# Patient Record
Sex: Female | Born: 1935 | Race: Black or African American | Hispanic: No | Marital: Single | Smoking: Never smoker
Health system: Southern US, Community
[De-identification: ages and names within clinical notes are randomized; demographics above are authoritative.]

## PROBLEM LIST (undated history)

## (undated) DIAGNOSIS — I639 Cerebral infarction, unspecified: Secondary | ICD-10-CM

## (undated) DIAGNOSIS — E785 Hyperlipidemia, unspecified: Secondary | ICD-10-CM

## (undated) DIAGNOSIS — E119 Type 2 diabetes mellitus without complications: Secondary | ICD-10-CM

## (undated) DIAGNOSIS — I10 Essential (primary) hypertension: Secondary | ICD-10-CM

## (undated) DIAGNOSIS — I739 Peripheral vascular disease, unspecified: Secondary | ICD-10-CM

## (undated) HISTORY — DX: Essential (primary) hypertension: I10

## (undated) HISTORY — DX: Hyperlipidemia, unspecified: E78.5

---

## 2019-05-05 ENCOUNTER — Telehealth: Payer: Self-pay

## 2019-05-05 NOTE — Telephone Encounter (Signed)
Copied from [place] [id number]. Topic: General - Other >> May 05, 2019  2:11 PM Ivar Drape wrote: Reason for CRM: Patient has a new patient appt on 05/18/2019 and wants the practice to call her old provider so her records will be there before her first appt.  Dr. Theotis Barrio  [phone]

## 2019-05-06 NOTE — Telephone Encounter (Signed)
Patient has called back and been informed she needs to sign a release of records at that office for them to release to our office.  She verbalized understanding.

## 2019-05-06 NOTE — Telephone Encounter (Signed)
Patient informed Medical Records Release form has to be filled out, advised the patient if she didn't want to come in prior to her appointment contact her previous providers and inquire if they can release records. Patient will have records fax to

## 2019-05-06 NOTE — Telephone Encounter (Signed)
Called no answer no voicemail set up

## 2019-05-06 NOTE — Telephone Encounter (Signed)
Called no answer/no msg.///she will need to sign a release records for this

## 2019-05-11 ENCOUNTER — Ambulatory Visit: Payer: Self-pay | Admitting: Family Medicine

## 2019-05-17 ENCOUNTER — Other Ambulatory Visit: Payer: Self-pay

## 2019-05-18 ENCOUNTER — Other Ambulatory Visit: Payer: Self-pay | Admitting: Family Medicine

## 2019-05-18 ENCOUNTER — Ambulatory Visit: Payer: Medicare HMO | Admitting: Family Medicine

## 2019-05-18 ENCOUNTER — Encounter: Payer: Self-pay | Admitting: Family Medicine

## 2019-05-18 VITALS — BP 160/86 | HR 50 | Temp 97.1°F | Ht 64.0 in | Wt 158.0 lb

## 2019-05-18 DIAGNOSIS — E785 Hyperlipidemia, unspecified: Secondary | ICD-10-CM

## 2019-05-18 DIAGNOSIS — E119 Type 2 diabetes mellitus without complications: Secondary | ICD-10-CM | POA: Diagnosis not present

## 2019-05-18 DIAGNOSIS — I739 Peripheral vascular disease, unspecified: Secondary | ICD-10-CM

## 2019-05-18 DIAGNOSIS — E2839 Other primary ovarian failure: Secondary | ICD-10-CM

## 2019-05-18 DIAGNOSIS — Z23 Encounter for immunization: Secondary | ICD-10-CM

## 2019-05-18 DIAGNOSIS — R7989 Other specified abnormal findings of blood chemistry: Secondary | ICD-10-CM

## 2019-05-18 DIAGNOSIS — I10 Essential (primary) hypertension: Secondary | ICD-10-CM

## 2019-05-18 LAB — LIPID PANEL
Cholesterol: 140 mg/dL (ref 0–200)
HDL: 59.7 mg/dL (ref >39.00)
LDL Cholesterol: 60 mg/dL (ref 0–99)
NonHDL: 79.92
Total CHOL/HDL Ratio: 2
Triglycerides: 100 mg/dL (ref 0.0–149.0)
VLDL: 20 mg/dL (ref 0.0–40.0)

## 2019-05-18 LAB — COMPREHENSIVE METABOLIC PANEL
ALT: 9 U/L (ref 0–35)
AST: 13 U/L (ref 0–37)
Albumin: 4.3 g/dL (ref 3.5–5.2)
Alkaline Phosphatase: 131 U/L — ABNORMAL HIGH (ref 39–117)
BUN: 10 mg/dL (ref 6–23)
CO2: 28 mEq/L (ref 19–32)
Calcium: 10 mg/dL (ref 8.4–10.5)
Chloride: 104 mEq/L (ref 96–112)
Creatinine, Ser: 0.92 mg/dL (ref 0.40–1.20)
GFR: 70.7 mL/min (ref >60.00)
Glucose, Bld: 126 mg/dL — ABNORMAL HIGH (ref 70–99)
Potassium: 3.9 mEq/L (ref 3.5–5.1)
Sodium: 143 mEq/L (ref 135–145)
Total Bilirubin: 0.4 mg/dL (ref 0.2–1.2)
Total Protein: 7.6 g/dL (ref 6.0–8.3)

## 2019-05-18 LAB — HEMOGLOBIN A1C: Hgb A1c MFr Bld: 6.5 % (ref 4.6–6.5)

## 2019-05-18 LAB — MICROALBUMIN / CREATININE URINE RATIO
Creatinine,U: 200.9 mg/dL
Microalb Creat Ratio: 7.4 mg/g (ref 0.0–30.0)
Microalb, Ur: 14.9 mg/dL — ABNORMAL HIGH (ref 0.0–1.9)

## 2019-05-18 MED ORDER — HYDROCHLOROTHIAZIDE 25 MG PO TABS: 25.0000 mg | ORAL_TABLET | Freq: Every day | ORAL | 2 refills | Status: AC

## 2019-05-18 MED ORDER — ROSUVASTATIN CALCIUM 10 MG PO TABS: 10.0000 mg | ORAL_TABLET | Freq: Every day | ORAL | 2 refills | Status: AC

## 2019-05-18 MED ORDER — METFORMIN HCL 500 MG PO TABS: 500.0000 mg | ORAL_TABLET | Freq: Every day | ORAL | 2 refills | Status: AC

## 2019-05-18 MED ORDER — CLOPIDOGREL BISULFATE 75 MG PO TABS: 75.0000 mg | ORAL_TABLET | Freq: Every day | ORAL | 2 refills | Status: AC

## 2019-05-18 MED ORDER — AMLODIPINE BESYLATE 10 MG PO TABS: 10.0000 mg | ORAL_TABLET | Freq: Every day | ORAL | 2 refills | Status: AC

## 2019-05-18 MED ORDER — HYDRALAZINE HCL 50 MG PO TABS: 50.0000 mg | ORAL_TABLET | Freq: Three times a day (TID) | ORAL | 2 refills | Status: AC

## 2019-05-18 NOTE — Progress Notes (Signed)
Chief Complaint  Patient presents with  . New Patient (Initial Visit)       New Patient Visit SUBJECTIVE: HPI: Rhonda Golden is an 84 y.o.female who is being seen for establishing care.  The patient was previously seen in [place].  Hypertension Patient presents for hypertension follow up. She does monitor home blood pressures. Blood pressures ranging on average from 371-062'I systolic.  She is compliant with medications. Patient has these side effects of medication: none She is adhering to a healthy diet overall. Exercise: some walking  Patient has a recent history of diabetes.  She is taking metformin 500 mg daily.  No side effects and does report compliance.  Last set of labs/urine was over a year ago.  Reports diet is fair and she does do some walking.  Last eye exam was around 10 months ago.  She had a pneumonia shot a couple years ago, does not remember which one it was.  Hyperlipidemia Patient presents for dyslipidemia follow up. Currently being treated with Crestor 10 mg/d and compliance with treatment thus far has been good. She denies myalgias. She is adhering to a healthy diet. Exercise: some walking  PAD Patient has a history of peripheral artery disease with a history of bilateral stents.  She does take Plavix daily.  She is not on aspirin.  She is not having any pain in her lower extremities when she walks.  There is no swelling.     No Known Allergies  Past Medical History:  Diagnosis Date  . Hyperlipidemia   . Hypertension    History reviewed. No pertinent surgical history. Family History  Problem Relation Age of Onset  . Hypertension Mother   . Hypertension Father    No Known Allergies  Current Outpatient Medications:  .  amLODipine (NORVASC) 10 MG tablet, Take 10 mg by mouth daily., Disp: , Rfl:  .  clopidogrel (PLAVIX) 75 MG tablet, Take 75 mg by mouth daily., Disp: , Rfl:  .  hydrALAZINE (APRESOLINE) 50 MG tablet, Take 50 mg by mouth 3 (three) times  daily., Disp: , Rfl:  .  hydrochlorothiazide (HYDRODIURIL) 25 MG tablet, Take 25 mg by mouth daily., Disp: , Rfl:  .  metFORMIN (GLUCOPHAGE) 500 MG tablet, Take by mouth daily., Disp: , Rfl:  .  rosuvastatin (CRESTOR) 10 MG tablet, Take 10 mg by mouth daily., Disp: , Rfl:   ROS Constitutional: No fevers Endo: No weight loss Lungs: No shortness of breath Cardiovascular: No swelling or chest pain Skin: No sores on the feet Neuro: No numbness/tingling MSK: No pain in the feet GI: No diarrhea GU: No polyuria Eyes:+ Dry eyes Ears: No recent hearing loss   OBJECTIVE: BP (!) 160/86 (BP Location: Left Arm, Patient Position: Sitting, Cuff Size: Normal)   Pulse (!) 50   Temp (!) 97.1 F (36.2 C) (Temporal)   Ht 5\' 4"  (1.626 m)   Wt 158 lb (71.7 kg)   SpO2 93%   BMI 27.12 kg/m   Constitutional: -  VS reviewed -  Well developed, well nourished, appears stated age -  No apparent distress  Psychiatric: -  Oriented to person, place, and time -  Memory intact -  Affect and mood normal -  Fluent conversation, good eye contact -  Judgment and insight age appropriate  Eye: -  Conjunctivae clear, no discharge -  Pupils symmetric, round, reactive to light  ENMT: -  MMM    Pharynx moist, no exudate, no erythema  Neck: -  No  gross swelling, no palpable masses -  Thyroid midline, not enlarged, mobile, no palpable masses  Cardiovascular: -  RRR -  +SEM heard loudest at the aortic listening post -  No LE edema  Respiratory: -  Normal respiratory effort, no accessory muscle use, no retraction -  Breath sounds equal, no wheezes, no ronchi, no crackles  Gastrointestinal: -  Bowel sounds normal -  No tenderness, no distention, no guarding, no masses  Neurological:  -  CN II - XII grossly intact -  Sensation intact to pinprick bilaterally -  Sensation grossly intact to light touch, equal bilaterally  Musculoskeletal: -  No clubbing, no cyanosis -  Gait slow, not shuffling or antalgic  Skin:  -  No significant lesion on inspection of the feet -  Warm and dry to palpation   ASSESSMENT/PLAN: Diabetes mellitus type 2 in nonobese (HCC) - Plan: Hemoglobin A1c, Lipid panel, Comprehensive metabolic panel, Microalbumin / creatinine urine ratio  PAD (peripheral artery disease) (HCC) - Plan: Ambulatory referral to Cardiology  Essential hypertension  Hyperlipidemia, unspecified hyperlipidemia type  Estrogen deficiency - Plan: DG Bone Density  White coat syndrome with hypertension  Patient instructed to sign release of records form from her previous PCP.  Need to see when her last set of immunizations were. Okay with blood pressure given history of whitecoat syndrome.  She states that she checks at home daily. Counseled on diet and exercise. Refer to cardiology for PAD per her request.  Continue Plavix in the meantime. Patient should return 6 mo or prn. The patient voiced understanding and agreement to the plan.   Jilda Roche [place], DO 05/18/19  9:32 AM

## 2019-05-18 NOTE — Patient Instructions (Addendum)
Keep the diet clean and stay active.  If you do not hear anything about your referral in the next 1-2 weeks, call our office and ask for an update.  Give [place] 2-3 business days to get the results of your labs back.   Someone will call you regarding your bone density scan.  Find out when your tetanus shot and pneumonia shot were, and which one the pneumonia shot was if possible.  Let us know if you need anything.

## 2019-05-18 NOTE — Addendum Note (Signed)
Addended by: Sharon Seller B on: 05/18/2019 09:39 AM   Modules accepted: Orders

## 2019-06-15 ENCOUNTER — Other Ambulatory Visit: Payer: Medicare HMO

## 2019-06-22 ENCOUNTER — Other Ambulatory Visit (INDEPENDENT_AMBULATORY_CARE_PROVIDER_SITE_OTHER): Payer: Medicare HMO

## 2019-06-22 ENCOUNTER — Other Ambulatory Visit: Payer: Self-pay | Admitting: Family Medicine

## 2019-06-22 ENCOUNTER — Other Ambulatory Visit: Payer: Self-pay

## 2019-06-22 DIAGNOSIS — R748 Abnormal levels of other serum enzymes: Secondary | ICD-10-CM

## 2019-06-22 DIAGNOSIS — R7989 Other specified abnormal findings of blood chemistry: Secondary | ICD-10-CM | POA: Diagnosis not present

## 2019-06-22 LAB — HEPATIC FUNCTION PANEL
ALT: 9 U/L (ref 0–35)
AST: 12 U/L (ref 0–37)
Albumin: 4.1 g/dL (ref 3.5–5.2)
Alkaline Phosphatase: 130 U/L — ABNORMAL HIGH (ref 39–117)
Bilirubin, Direct: 0.1 mg/dL (ref 0.0–0.3)
Total Bilirubin: 0.4 mg/dL (ref 0.2–1.2)
Total Protein: 7.2 g/dL (ref 6.0–8.3)

## 2019-06-23 ENCOUNTER — Other Ambulatory Visit (INDEPENDENT_AMBULATORY_CARE_PROVIDER_SITE_OTHER): Payer: Medicare HMO

## 2019-06-23 DIAGNOSIS — R748 Abnormal levels of other serum enzymes: Secondary | ICD-10-CM | POA: Diagnosis not present

## 2019-06-23 LAB — GAMMA GT: GGT: 15 U/L (ref 7–51)

## 2019-06-28 LAB — ALKALINE PHOSPHATASE ISOENZYMES
Alkaline phosphatase (APISO): 129 U/L (ref 37–153)
Bone Isoenzymes: 39 % (ref 28–66)
Intestinal Isoenzymes: 3 % (ref 1–24)
Liver Isoenzymes: 58 % (ref 25–69)

## 2019-08-10 ENCOUNTER — Encounter: Payer: Self-pay | Admitting: Family Medicine

## 2019-08-10 ENCOUNTER — Ambulatory Visit: Payer: Medicare HMO | Admitting: Family Medicine

## 2019-08-10 ENCOUNTER — Other Ambulatory Visit: Payer: Self-pay

## 2019-08-10 VITALS — BP 132/88 | HR 99 | Temp 96.6°F | Wt 150.0 lb

## 2019-08-10 DIAGNOSIS — S76312A Strain of muscle, fascia and tendon of the posterior muscle group at thigh level, left thigh, initial encounter: Secondary | ICD-10-CM

## 2019-08-10 DIAGNOSIS — M7062 Trochanteric bursitis, left hip: Secondary | ICD-10-CM | POA: Diagnosis not present

## 2019-08-10 MED ORDER — PREDNISONE 20 MG PO TABS: 40.0000 mg | ORAL_TABLET | Freq: Every day | ORAL | 0 refills | Status: DC

## 2019-08-10 NOTE — Patient Instructions (Signed)
Heat (pad or rice pillow in microwave) over affected area, 10-15 minutes twice daily.   Ice/cold pack over area for 10-15 min twice daily.  OK to take Tylenol 1000 mg (2 extra strength tabs) or 975 mg (3 regular strength tabs) every 6 hours as needed.  Iliotibial Band Syndrome Rehab It is normal to feel mild stretching, pulling, tightness, or discomfort as you do these exercises, but you should stop right away if you feel sudden pain or your pain gets worse.  Stretching and range of motion exercises These exercises warm up your muscles and joints and improve the movement and flexibility of your hip and pelvis. Exercise A: Quadriceps, prone    1. Lie on your abdomen on a firm surface, such as a bed or padded floor. 2. Bend your left / right knee and hold your ankle. If you cannot reach your ankle or pant leg, loop a belt around your foot and grab the belt instead. 3. Gently pull your heel toward your buttocks. Your knee should not slide out to the side. You should feel a stretch in the front of your thigh and knee. 4. Hold this position for 30 seconds. Repeat 2 times. Complete this stretch 3 times per week. Exercise B: Iliotibial band    1. Lie on your side with your left / right leg in the top position. 2. Bend both of your knees and grab your left / right ankle. Stretch out your bottom arm to help you balance. 3. Slowly bring your top knee back so your thigh goes behind your trunk. 4. Slowly lower your top leg toward the floor until you feel a gentle stretch on the outside of your left / right hip and thigh. If you do not feel a stretch and your knee will not fall farther, place the heel of your other foot on top of your knee and pull your knee down toward the floor with your foot. 5. Hold this position for 30 seconds. Repeat 2 times. Complete this stretch 3 times per week. Strengthening exercises These exercises build strength and endurance in your hip and pelvis. Endurance is the  ability to use your muscles for a long time, even after they get tired. Exercise C: Straight leg raises (hip abductors)     1. Lie on your side with your left / right leg in the top position. Lie so your head, shoulder, knee, and hip line up. You may bend your bottom knee to help you balance. 2. Roll your hips slightly forward so your hips are stacked directly over each other and your left / right knee is facing forward. 3. Tense the muscles in your outer thigh and lift your top leg 4-6 inches (10-15 cm). 4. Hold this position for 3 seconds. Repeat for a total of 10 reps. 5. Slowly return to the starting position. Let your muscles relax completely before doing another repetition. Repeat 2 times. Complete this exercise 3 times per week. Exercise D: Straight leg raises (hip extensors) 1. Lie on your abdomen on your bed or a firm surface. You can put a pillow under your hips if that is more comfortable. 2. Bend your left / right knee so your foot is straight up in the air. 3. Squeeze your buttock muscles and lift your left / right thigh off the bed. Do not let your back arch. 4. Tense this muscle as hard as you can without increasing any knee pain. 5. Hold this position for 2 seconds. Repeat for  a total of 10 reps 6. Slowly lower your leg to the starting position and allow it to relax completely. Repeat 2 times. Complete this exercise 3 times per week. Exercise E: Hip hike 1. Stand sideways on a bottom step. Stand on your left / right leg with your other foot unsupported next to the step. You can hold onto the railing or wall if needed for balance. 2. Keep your knees straight and your torso square. Then, lift your left / right hip up toward the ceiling. 3. Slowly let your left / right hip lower toward the floor, past the starting position. Your foot should get closer to the floor. Do not lean or bend your knees. Repeat 2 times. Complete this exercise 3 times per week.  Document Released:  07/28/2005 Document Revised: 04/01/2016 Document Reviewed: 06/29/2015 Elsevier Interactive Patient Education  2018 [facility]  It is normal to feel mild stretching, pulling, tightness, or discomfort as you do these exercises, but you should stop right away if you feel sudden pain or your pain gets worse.  Stretching and range of motion exercises These exercises warm up your muscles and joints and improve the movement and flexibility of your thigh. These exercises also help to relieve pain, numbness, and tingling. Exercise A: Hamstring stretch, supine    1. Lie on your back. Loop a belt or towel across the ball of your left / right foot The ball of your foot is on the walking surface, right under your toes. 2. Straighten your left / right knee and slowly pull on the belt to raise your leg. Stop when you feel a gentle stretch behind your left / right knee or thigh. ? Do not allow the knee to bend. ? Keep your other leg flat on the floor. 3. Hold this position for 30 seconds. Repeat 2 times. Complete this exercise 3 times a week. Strengthening exercises These exercises build strength and endurance in your thigh. Endurance is the ability to use your muscles for a long time, even after they get tired. Exercise B: Straight leg raises (hip extensors) 5. Lie on your belly on a bed or a firm surface with a pillow under your hips. 6. Bend your left / right knee so your foot is straight up in the air. 7. Squeeze your buttock muscles and lift your left / right thigh off the bed. Do not let your back arch. 8. Hold this position for 3 seconds. 9. Slowly return to the starting position. Let your muscles relax completely before you do another repetition. Repeat 2 times. Complete this exercise 3 times a week. Exercise C: Bridge (hip extensors)     1. Lie on your back on a firm surface with your knees bent and your feet flat on the floor. 2. Tighten your buttocks  muscles and lift your bottom off the floor until your trunk is level with your thighs. ? You should feel the muscles working in your buttocks and the back of your thighs. If you do not feel these muscles, slide your feet 1-2 inches (2.5-5 cm) farther away from your buttocks. ? Do not arch your back. 3. Hold this position for 3 seconds. 4. Slowly lower your hips to the starting position. 5. Let your buttocks muscles relax completely between repetitions. If this exercise is too easy, try doing it with your arms crossed over your chest. Repeat 2 times. Complete this exercise 3 times a week. Exercise D: Hamstring eccentric, prone  6. Lie on your belly on a bed or on the floor. 7. Start with your legs straight. Cross your legs at the ankles with your left / right leg on top. 8. Using your bottom leg to do the work, bend both knees. 9. Using just your left / right leg alone, slowly lower your leg back down toward the bed. Add a 5 lb weight as told by your health care provider. 10. Let your muscles relax completely between repetitions. Repeat 2 times. Complete this exercise 3 times a week. Exercise E: Squats 1. Stand in front of a table, with your feet and knees pointing straight ahead. You may rest your hands on the table for balance but not for support. 2. Slowly bend your knees and lower your hips like you are going to sit in a chair. Keep your thighs straight or pointed slightly outward. ? Keep your weight over your heels, not over your toes. ? Keep your lower legs upright so they are parallel with the table legs. ? Do not let your hips go lower than your knees. Stop when your knees are bent to the shape of an upside-down letter "L" (90 degree angle). ? Do not bend lower than told by your health care provider. ? If your knee pain increases, do not bend as low. 3. Hold the squat position 1-2 seconds. 4. Slowly push with your legs to return to standing. Do not use your hands to pull yourself to  standing. Repeat 2 times. Complete this exercise 3 times a week. Make sure you discuss any questions you have with your health care provider. Document Released: 07/28/2005 Document Revised: 04/03/2016 Document Reviewed: 05/01/2015 Elsevier Interactive Patient Education  [employer].

## 2019-08-10 NOTE — Progress Notes (Signed)
Musculoskeletal Exam  Patient: Rhonda Golden DOB: 26-Jul-1935  DOS: 08/10/2019  SUBJECTIVE:  Chief Complaint:   Chief Complaint  Patient presents with  . Extremity Weakness    left leg     Rhonda Golden is a 84 y.o.  female for evaluation and treatment of LLE pain.   Onset:  1 day ago. No inj or change in activity.  Location: L outer hip, behind knee Character:  aching and sharp  Progression of issue:  is unchanged Associated symptoms: radiates down leg and behind knee Treatment: to date has been none.   Neurovascular symptoms: no  ROS: Musculoskeletal/Extremities: +L hip pain Neuro: No numbness/tingling  Past Medical History:  Diagnosis Date  . Hyperlipidemia   . Hypertension     Objective: VITAL SIGNS: BP 132/88 (BP Location: Left Arm, Patient Position: Sitting, Cuff Size: Large)   Pulse 99   Temp (!) 96.6 F (35.9 C) (Temporal)   Wt 150 lb (68 kg)   SpO2 95%   BMI 25.75 kg/m  Constitutional: Well formed, well developed. No acute distress. Cardiovascular: Brisk cap refill Thorax & Lungs: No accessory muscle use Musculoskeletal: L hip.   Normal active range of motion: yes.   Normal passive range of motion: yes Tenderness to palpation: yes over greater troch bursa and distal hamstring Deformity: no Ecchymosis: no Tests positive: none Tests negative: log roll, stinchfield Neurologic: Normal sensory function.  Psychiatric: Normal mood. Age appropriate judgment and insight. Alert & oriented x 3.    Assessment:  Greater trochanteric bursitis of left hip - Plan: predniSONE (DELTASONE) 20 MG tablet  Strain of left hamstring, initial encounter - Plan: predniSONE (DELTASONE) 20 MG tablet  Plan: Orders as above. Tylenol, ice, heat, stretches for IT band and hamstring. F/u next week if no improvement, may have to do injection, which she was not interested in today. The patient and her daughter voiced understanding and agreement to the plan.   [place], DO 08/10/19  5:02 PM

## 2019-08-11 ENCOUNTER — Inpatient Hospital Stay (HOSPITAL_COMMUNITY)
Admission: EM | Admit: 2019-08-11 | Discharge: 2019-08-18 | DRG: 065 | Disposition: A | Discharge status: Skilled Nursing Facility | Payer: Medicare HMO | Attending: Internal Medicine | Admitting: Internal Medicine

## 2019-08-11 ENCOUNTER — Encounter: Payer: Self-pay | Admitting: *Deleted

## 2019-08-11 ENCOUNTER — Ambulatory Visit: Payer: Self-pay | Admitting: *Deleted

## 2019-08-11 ENCOUNTER — Encounter (HOSPITAL_COMMUNITY): Payer: Self-pay | Admitting: Emergency Medicine

## 2019-08-11 DIAGNOSIS — R29704 NIHSS score 4: Secondary | ICD-10-CM | POA: Diagnosis present

## 2019-08-11 DIAGNOSIS — I6350 Cerebral infarction due to unspecified occlusion or stenosis of unspecified cerebral artery: Secondary | ICD-10-CM | POA: Diagnosis not present

## 2019-08-11 DIAGNOSIS — I480 Paroxysmal atrial fibrillation: Secondary | ICD-10-CM | POA: Diagnosis present

## 2019-08-11 DIAGNOSIS — G8194 Hemiplegia, unspecified affecting left nondominant side: Secondary | ICD-10-CM | POA: Diagnosis present

## 2019-08-11 DIAGNOSIS — I10 Essential (primary) hypertension: Secondary | ICD-10-CM | POA: Diagnosis present

## 2019-08-11 DIAGNOSIS — Z20822 Contact with and (suspected) exposure to covid-19: Secondary | ICD-10-CM | POA: Diagnosis present

## 2019-08-11 DIAGNOSIS — I639 Cerebral infarction, unspecified: Secondary | ICD-10-CM

## 2019-08-11 DIAGNOSIS — I6340 Cerebral infarction due to embolism of unspecified cerebral artery: Secondary | ICD-10-CM | POA: Diagnosis not present

## 2019-08-11 DIAGNOSIS — E119 Type 2 diabetes mellitus without complications: Secondary | ICD-10-CM

## 2019-08-11 DIAGNOSIS — Z8249 Family history of ischemic heart disease and other diseases of the circulatory system: Secondary | ICD-10-CM

## 2019-08-11 DIAGNOSIS — E876 Hypokalemia: Secondary | ICD-10-CM | POA: Diagnosis not present

## 2019-08-11 DIAGNOSIS — E785 Hyperlipidemia, unspecified: Secondary | ICD-10-CM | POA: Diagnosis present

## 2019-08-11 DIAGNOSIS — E1151 Type 2 diabetes mellitus with diabetic peripheral angiopathy without gangrene: Secondary | ICD-10-CM | POA: Diagnosis present

## 2019-08-11 DIAGNOSIS — R4781 Slurred speech: Secondary | ICD-10-CM | POA: Diagnosis present

## 2019-08-11 DIAGNOSIS — Z7902 Long term (current) use of antithrombotics/antiplatelets: Secondary | ICD-10-CM

## 2019-08-11 DIAGNOSIS — I739 Peripheral vascular disease, unspecified: Secondary | ICD-10-CM | POA: Diagnosis present

## 2019-08-11 HISTORY — DX: Type 2 diabetes mellitus without complications: E11.9

## 2019-08-11 HISTORY — DX: Cerebral infarction, unspecified: I63.9

## 2019-08-11 HISTORY — DX: Peripheral vascular disease, unspecified: I73.9

## 2019-08-11 LAB — I-STAT CHEM 8, ED
BUN: 25 mg/dL — ABNORMAL HIGH (ref 8–23)
Calcium, Ion: 1.16 mmol/L (ref 1.15–1.40)
Chloride: 108 mmol/L (ref 98–111)
Creatinine, Ser: 1 mg/dL (ref 0.44–1.00)
Glucose, Bld: 157 mg/dL — ABNORMAL HIGH (ref 70–99)
HCT: 44 % (ref 36.0–46.0)
Hemoglobin: 15 g/dL (ref 12.0–15.0)
Potassium: 3.7 mmol/L (ref 3.5–5.1)
Sodium: 142 mmol/L (ref 135–145)
TCO2: 23 mmol/L (ref 22–32)

## 2019-08-11 LAB — DIFFERENTIAL
Abs Immature Granulocytes: 0.04 10*3/uL (ref 0.00–0.07)
Basophils Absolute: 0 10*3/uL (ref 0.0–0.1)
Basophils Relative: 0 %
Eosinophils Absolute: 0 10*3/uL (ref 0.0–0.5)
Eosinophils Relative: 0 %
Immature Granulocytes: 0 %
Lymphocytes Relative: 6 %
Lymphs Abs: 0.6 10*3/uL — ABNORMAL LOW (ref 0.7–4.0)
Monocytes Absolute: 0.2 10*3/uL (ref 0.1–1.0)
Monocytes Relative: 2 %
Neutro Abs: 9.6 10*3/uL — ABNORMAL HIGH (ref 1.7–7.7)
Neutrophils Relative %: 92 %

## 2019-08-11 LAB — APTT: aPTT: 26 seconds (ref 24–36)

## 2019-08-11 LAB — COMPREHENSIVE METABOLIC PANEL
ALT: 14 U/L (ref 0–44)
AST: 19 U/L (ref 15–41)
Albumin: 3.9 g/dL (ref 3.5–5.0)
Alkaline Phosphatase: 97 U/L (ref 38–126)
Anion gap: 13 (ref 5–15)
BUN: 24 mg/dL — ABNORMAL HIGH (ref 8–23)
CO2: 21 mmol/L — ABNORMAL LOW (ref 22–32)
Calcium: 9.5 mg/dL (ref 8.9–10.3)
Chloride: 106 mmol/L (ref 98–111)
Creatinine, Ser: 1.08 mg/dL — ABNORMAL HIGH (ref 0.44–1.00)
GFR calc Af Amer: 55 mL/min — ABNORMAL LOW (ref >60)
GFR calc non Af Amer: 48 mL/min — ABNORMAL LOW (ref >60)
Glucose, Bld: 167 mg/dL — ABNORMAL HIGH (ref 70–99)
Potassium: 3.8 mmol/L (ref 3.5–5.1)
Sodium: 140 mmol/L (ref 135–145)
Total Bilirubin: 0.3 mg/dL (ref 0.3–1.2)
Total Protein: 7.9 g/dL (ref 6.5–8.1)

## 2019-08-11 LAB — CBC
HCT: 44.8 % (ref 36.0–46.0)
Hemoglobin: 13.6 g/dL (ref 12.0–15.0)
MCH: 23.3 pg — ABNORMAL LOW (ref 26.0–34.0)
MCHC: 30.4 g/dL (ref 30.0–36.0)
MCV: 76.7 fL — ABNORMAL LOW (ref 80.0–100.0)
Platelets: 311 10*3/uL (ref 150–400)
RBC: 5.84 MIL/uL — ABNORMAL HIGH (ref 3.87–5.11)
RDW: 17.2 % — ABNORMAL HIGH (ref 11.5–15.5)
WBC: 10.3 10*3/uL (ref 4.0–10.5)
nRBC: 0 % (ref 0.0–0.2)

## 2019-08-11 LAB — PROTIME-INR
INR: 1 (ref 0.8–1.2)
Prothrombin Time: 12.8 seconds (ref 11.4–15.2)

## 2019-08-11 MED ORDER — SODIUM CHLORIDE 0.9% FLUSH: 3.0000 mL | Freq: Once | INTRAVENOUS | Status: DC

## 2019-08-11 NOTE — ED Triage Notes (Signed)
EMS stated, she called cause of numbness in hands, bilateral grips equal. No muffled speech. Questionable any symptoms of a stroke.

## 2019-08-11 NOTE — Telephone Encounter (Signed)
This encounter was created in error - please disregard.

## 2019-08-11 NOTE — Telephone Encounter (Signed)
Daughter and cousin of pt called regarding weakness and dragging of her left foot. Pt also has slurred speech. Cousin stated she had tried to get off the bed and could not. And she was not like this this yesterday. She is advised to call 911. She stated that she would. Will route to [facility] at [facility] for review.  Reason for Disposition . [1] Weakness (i.e., paralysis, loss of muscle strength) of the face, arm / hand, or leg / foot on one side of the body AND [2] sudden onset AND [3] present now  Answer Assessment - Initial Assessment Questions 1. SYMPTOM: "What is the main symptom you are concerned about?" (e.g., weakness, numbness)     Weakness in left leg 2. ONSET: "When did this start?" (minutes, hours, days; while sleeping)     today 3. LAST NORMAL: "When was the last time you were normal (no symptoms)?"     The day before 4. PATTERN "Does this come and go, or has it been constant since it started?"  "Is it present now?"     constant 5. CARDIAC SYMPTOMS: "Have you had any of the following symptoms: chest pain, difficulty breathing, palpitations?"     no 6. NEUROLOGIC SYMPTOMS: "Have you had any of the following symptoms: headache, dizziness, vision loss, double vision, changes in speech, unsteady on your feet?"     Unsteady on her feet, slurred speech 7. OTHER SYMPTOMS: "Do you have any other symptoms?"      8. PREGNANCY: "Is there any chance you are pregnant?" "When was your last menstrual period?"     n/a  Protocols used: NEUROLOGIC DEFICIT-A-AH

## 2019-08-11 NOTE — ED Triage Notes (Signed)
Pt. Stated, I think maybe I had a mild stroke but not now.  VAN- negative.

## 2019-08-12 ENCOUNTER — Emergency Department (HOSPITAL_COMMUNITY): Payer: Medicare HMO

## 2019-08-12 ENCOUNTER — Encounter (HOSPITAL_COMMUNITY): Payer: Self-pay | Admitting: Internal Medicine

## 2019-08-12 ENCOUNTER — Other Ambulatory Visit: Payer: Self-pay

## 2019-08-12 ENCOUNTER — Encounter (HOSPITAL_COMMUNITY): Payer: Medicare HMO

## 2019-08-12 ENCOUNTER — Inpatient Hospital Stay (HOSPITAL_COMMUNITY): Payer: Medicare HMO

## 2019-08-12 DIAGNOSIS — I6350 Cerebral infarction due to unspecified occlusion or stenosis of unspecified cerebral artery: Secondary | ICD-10-CM | POA: Diagnosis present

## 2019-08-12 DIAGNOSIS — I6389 Other cerebral infarction: Secondary | ICD-10-CM | POA: Diagnosis not present

## 2019-08-12 DIAGNOSIS — Z7902 Long term (current) use of antithrombotics/antiplatelets: Secondary | ICD-10-CM | POA: Diagnosis not present

## 2019-08-12 DIAGNOSIS — E876 Hypokalemia: Secondary | ICD-10-CM | POA: Diagnosis not present

## 2019-08-12 DIAGNOSIS — R29704 NIHSS score 4: Secondary | ICD-10-CM | POA: Diagnosis present

## 2019-08-12 DIAGNOSIS — E785 Hyperlipidemia, unspecified: Secondary | ICD-10-CM | POA: Diagnosis present

## 2019-08-12 DIAGNOSIS — Z20822 Contact with and (suspected) exposure to covid-19: Secondary | ICD-10-CM | POA: Diagnosis present

## 2019-08-12 DIAGNOSIS — Z8249 Family history of ischemic heart disease and other diseases of the circulatory system: Secondary | ICD-10-CM | POA: Diagnosis not present

## 2019-08-12 DIAGNOSIS — E1151 Type 2 diabetes mellitus with diabetic peripheral angiopathy without gangrene: Secondary | ICD-10-CM | POA: Diagnosis present

## 2019-08-12 DIAGNOSIS — G8194 Hemiplegia, unspecified affecting left nondominant side: Secondary | ICD-10-CM | POA: Diagnosis present

## 2019-08-12 DIAGNOSIS — R4781 Slurred speech: Secondary | ICD-10-CM | POA: Diagnosis present

## 2019-08-12 DIAGNOSIS — I480 Paroxysmal atrial fibrillation: Secondary | ICD-10-CM | POA: Diagnosis present

## 2019-08-12 DIAGNOSIS — I739 Peripheral vascular disease, unspecified: Secondary | ICD-10-CM | POA: Diagnosis not present

## 2019-08-12 DIAGNOSIS — E119 Type 2 diabetes mellitus without complications: Secondary | ICD-10-CM | POA: Diagnosis not present

## 2019-08-12 DIAGNOSIS — I6340 Cerebral infarction due to embolism of unspecified cerebral artery: Secondary | ICD-10-CM | POA: Diagnosis present

## 2019-08-12 DIAGNOSIS — I10 Essential (primary) hypertension: Secondary | ICD-10-CM | POA: Diagnosis present

## 2019-08-12 LAB — GLUCOSE, CAPILLARY
Glucose-Capillary: 102 mg/dL — ABNORMAL HIGH (ref 70–99)
Glucose-Capillary: 104 mg/dL — ABNORMAL HIGH (ref 70–99)
Glucose-Capillary: 122 mg/dL — ABNORMAL HIGH (ref 70–99)

## 2019-08-12 LAB — CBG MONITORING, ED: Glucose-Capillary: 130 mg/dL — ABNORMAL HIGH (ref 70–99)

## 2019-08-12 LAB — HEPARIN LEVEL (UNFRACTIONATED): Heparin Unfractionated: 0.25 IU/mL — ABNORMAL LOW (ref 0.30–0.70)

## 2019-08-12 LAB — SARS CORONAVIRUS 2 (TAT 6-24 HRS): SARS Coronavirus 2: NEGATIVE

## 2019-08-12 LAB — ECHOCARDIOGRAM COMPLETE

## 2019-08-12 MED ORDER — HYDRALAZINE HCL 25 MG PO TABS: 50.0000 mg | ORAL_TABLET | Freq: Three times a day (TID) | ORAL | Status: DC

## 2019-08-12 MED ORDER — DILTIAZEM HCL-DEXTROSE 125-5 MG/125ML-% IV SOLN (PREMIX)
5.0000 mg/h | INTRAVENOUS | Status: DC
  Administered 2019-08-12: 5 mg/h via INTRAVENOUS
  Filled 2019-08-12 (×2): qty 125

## 2019-08-12 MED ORDER — HYDROCHLOROTHIAZIDE 25 MG PO TABS
25.0000 mg | ORAL_TABLET | Freq: Every day | ORAL | Status: DC
  Administered 2019-08-12: 08:00:00 25 mg via ORAL
  Filled 2019-08-12: qty 1

## 2019-08-12 MED ORDER — ASPIRIN 325 MG PO TABS: 325.0000 mg | ORAL_TABLET | Freq: Every day | ORAL | Status: DC

## 2019-08-12 MED ORDER — SENNOSIDES-DOCUSATE SODIUM 8.6-50 MG PO TABS: 1.0000 | ORAL_TABLET | Freq: Every evening | ORAL | Status: DC | PRN

## 2019-08-12 MED ORDER — BRIMONIDINE TARTRATE 0.15 % OP SOLN
1.0000 [drp] | Freq: Three times a day (TID) | OPHTHALMIC | Status: DC
  Administered 2019-08-12 – 2019-08-18 (×17): 1 [drp] via OPHTHALMIC
  Filled 2019-08-12: qty 5

## 2019-08-12 MED ORDER — AMLODIPINE BESYLATE 5 MG PO TABS: 10.0000 mg | ORAL_TABLET | Freq: Every day | ORAL | Status: DC

## 2019-08-12 MED ORDER — HYDROCHLOROTHIAZIDE 25 MG PO TABS: 25.0000 mg | ORAL_TABLET | Freq: Every day | ORAL | Status: DC

## 2019-08-12 MED ORDER — CLOPIDOGREL BISULFATE 75 MG PO TABS
75.0000 mg | ORAL_TABLET | Freq: Every day | ORAL | Status: DC
  Administered 2019-08-13: 11:00:00 75 mg via ORAL
  Filled 2019-08-12: qty 1

## 2019-08-12 MED ORDER — STROKE: EARLY STAGES OF RECOVERY BOOK: Freq: Once | Status: AC

## 2019-08-12 MED ORDER — HALOPERIDOL LACTATE 5 MG/ML IJ SOLN
2.0000 mg | Freq: Four times a day (QID) | INTRAMUSCULAR | Status: DC | PRN
  Administered 2019-08-12: 12:00:00 2 mg via INTRAVENOUS
  Filled 2019-08-12: qty 1

## 2019-08-12 MED ORDER — HYDRALAZINE HCL 25 MG PO TABS
50.0000 mg | ORAL_TABLET | Freq: Three times a day (TID) | ORAL | Status: DC
  Administered 2019-08-12: 08:00:00 50 mg via ORAL
  Filled 2019-08-12: qty 2

## 2019-08-12 MED ORDER — ENOXAPARIN SODIUM 40 MG/0.4ML ~~LOC~~ SOLN: 40.0000 mg | Freq: Every day | SUBCUTANEOUS | Status: DC

## 2019-08-12 MED ORDER — ACETAMINOPHEN 160 MG/5ML PO SOLN
650.0000 mg | ORAL | Status: DC | PRN
  Administered 2019-08-15: 09:00:00 650 mg
  Filled 2019-08-12: qty 20.3

## 2019-08-12 MED ORDER — ASPIRIN EC 81 MG PO TBEC: 81.0000 mg | DELAYED_RELEASE_TABLET | Freq: Every day | ORAL | Status: DC

## 2019-08-12 MED ORDER — SODIUM CHLORIDE 0.9 % IV SOLN: INTRAVENOUS | Status: DC

## 2019-08-12 MED ORDER — INSULIN ASPART 100 UNIT/ML ~~LOC~~ SOLN
0.0000 [IU] | Freq: Three times a day (TID) | SUBCUTANEOUS | Status: DC
  Administered 2019-08-12 – 2019-08-13 (×2): 2 [IU] via SUBCUTANEOUS
  Administered 2019-08-13: 17:00:00 3 [IU] via SUBCUTANEOUS
  Administered 2019-08-14: 13:00:00 2 [IU] via SUBCUTANEOUS
  Administered 2019-08-16 – 2019-08-17 (×2): 3 [IU] via SUBCUTANEOUS
  Administered 2019-08-18 (×2): 2 [IU] via SUBCUTANEOUS

## 2019-08-12 MED ORDER — ROSUVASTATIN CALCIUM 5 MG PO TABS
10.0000 mg | ORAL_TABLET | Freq: Every day | ORAL | Status: DC
  Administered 2019-08-13 – 2019-08-18 (×6): 10 mg via ORAL
  Filled 2019-08-12 (×6): qty 2

## 2019-08-12 MED ORDER — ASPIRIN 300 MG RE SUPP: 300.0000 mg | Freq: Every day | RECTAL | Status: DC

## 2019-08-12 MED ORDER — LORAZEPAM 2 MG/ML IJ SOLN
1.0000 mg | Freq: Once | INTRAMUSCULAR | Status: AC
  Administered 2019-08-12: 09:00:00 1 mg via INTRAVENOUS
  Filled 2019-08-12: qty 1

## 2019-08-12 MED ORDER — ACETAMINOPHEN 650 MG RE SUPP: 650.0000 mg | RECTAL | Status: DC | PRN

## 2019-08-12 MED ORDER — INSULIN ASPART 100 UNIT/ML ~~LOC~~ SOLN: 0.0000 [IU] | Freq: Every day | SUBCUTANEOUS | Status: DC

## 2019-08-12 MED ORDER — ACETAMINOPHEN 325 MG PO TABS
650.0000 mg | ORAL_TABLET | ORAL | Status: DC | PRN
  Administered 2019-08-17: 325 mg via ORAL
  Administered 2019-08-18: 12:00:00 650 mg via ORAL
  Filled 2019-08-12 (×3): qty 2

## 2019-08-12 MED ORDER — AMLODIPINE BESYLATE 5 MG PO TABS
10.0000 mg | ORAL_TABLET | Freq: Every day | ORAL | Status: DC
  Administered 2019-08-12: 10 mg via ORAL
  Filled 2019-08-12: qty 2

## 2019-08-12 MED ORDER — HEPARIN (PORCINE) 25000 UT/250ML-% IV SOLN
1000.0000 [IU]/h | INTRAVENOUS | Status: DC
  Administered 2019-08-12: 15:00:00 900 [IU]/h via INTRAVENOUS
  Administered 2019-08-13: 1000 [IU]/h via INTRAVENOUS
  Filled 2019-08-12 (×2): qty 250

## 2019-08-12 NOTE — ED Notes (Signed)
Patient returns from MRI very drowsy.

## 2019-08-12 NOTE — Plan of Care (Signed)
  Problem: Self-Care: Goal: Ability to communicate needs accurately will improve Outcome: Progressing   Problem: Nutrition: Goal: Risk of aspiration will decrease Outcome: Progressing   Problem: Ischemic Stroke/TIA Tissue Perfusion: Goal: Complications of ischemic stroke/TIA will be minimized Outcome: Progressing

## 2019-08-12 NOTE — ED Provider Notes (Signed)
[facility] EMERGENCY DEPARTMENT Provider Note   CSN: [id number] Arrival date & time: 08/11/19  1907     History Chief Complaint  Patient presents with  . Cerebrovascular Accident    Questionable no symptoms    Rhonda Golden is a 85 y.o. female.  HPI   Patient presents to the emergency room for evaluation of weakness.  Patient states she noticed the symptoms yesterday morning when she tried to get up.  Patient felt weak.  She has had a stroke in the past but she feels the left side of her body is weaker than usual.  Patient was concerned that she had another stroke.  According to the EMS report the patient called EMS yesterday evening.  She arrived over 12 hours ago.  At that time the report was she was having numbness in her hands.  Patient feels that the symptoms persist.  She is not having any headache.  No fevers or chills.  Past Medical History:  Diagnosis Date  . Hyperlipidemia   . Hypertension     Patient Active Problem List   Diagnosis Date Noted  . Diabetes mellitus type 2 in nonobese (HCC) 05/18/2019  . PAD (peripheral artery disease) (HCC) 05/18/2019  . Hyperlipidemia 05/18/2019  . Essential hypertension 05/18/2019    History reviewed. No pertinent surgical history.   OB History   No obstetric history on file.     Family History  Problem Relation Age of Onset  . Hypertension Mother   . Hypertension Father     Social History   Tobacco Use  . Smoking status: Never Smoker  . Smokeless tobacco: Never Used  Substance Use Topics  . Alcohol use: Not on file  . Drug use: Not on file    Home Medications Prior to Admission medications   Medication Sig Start Date End Date Taking? Authorizing Provider  amLODipine (NORVASC) 10 MG tablet Take 1 tablet (10 mg total) by mouth daily. 05/18/19   Sharlene Dory, DO  clopidogrel (PLAVIX) 75 MG tablet Take 1 tablet (75 mg total) by mouth daily. 05/18/19   Sharlene Dory, DO    hydrALAZINE (APRESOLINE) 50 MG tablet Take 1 tablet (50 mg total) by mouth 3 (three) times daily. 05/18/19   Sharlene Dory, DO  hydrochlorothiazide (HYDRODIURIL) 25 MG tablet Take 1 tablet (25 mg total) by mouth daily. 05/18/19   Sharlene Dory, DO  metFORMIN (GLUCOPHAGE) 500 MG tablet Take 1 tablet (500 mg total) by mouth daily. 05/18/19   Sharlene Dory, DO  predniSONE (DELTASONE) 20 MG tablet Take 2 tablets (40 mg total) by mouth daily with breakfast for 5 days. 08/10/19 08/15/19  Sharlene Dory, DO  rosuvastatin (CRESTOR) 10 MG tablet Take 1 tablet (10 mg total) by mouth daily. 05/18/19   Sharlene Dory, DO    Allergies    Patient has no known allergies.  Review of Systems   Review of Systems  All other systems reviewed and are negative.   Physical Exam Updated Vital Signs BP (!) 194/75 (BP Location: Right Arm)   Pulse 73   Temp 97.8 F (36.6 C) (Oral)   Resp (!) 23   SpO2 99%   Physical Exam Vitals and nursing note reviewed.  Constitutional:      General: She is not in acute distress.    Appearance: She is well-developed.  HENT:     Head: Normocephalic and atraumatic.     Right Ear: External ear normal.  Left Ear: External ear normal.  Eyes:     General: No scleral icterus.       Right eye: No discharge.        Left eye: No discharge.     Conjunctiva/sclera: Conjunctivae normal.  Neck:     Trachea: No tracheal deviation.  Cardiovascular:     Rate and Rhythm: Normal rate and regular rhythm.  Pulmonary:     Effort: Pulmonary effort is normal. No respiratory distress.     Breath sounds: Normal breath sounds. No stridor. No wheezing or rales.  Abdominal:     General: Bowel sounds are normal. There is no distension.     Palpations: Abdomen is soft.     Tenderness: There is no abdominal tenderness. There is no guarding or rebound.  Musculoskeletal:        General: No tenderness.     Cervical back: Neck supple.  Skin:     General: Skin is warm and dry.     Findings: No rash.  Neurological:     Mental Status: She is alert and oriented to person, place, and time.     Cranial Nerves: No cranial nerve deficit (left  facial droop, extraocular movements intact, tongue midline   ).     Sensory: No sensory deficit.     Motor: Weakness present. No abnormal muscle tone or seizure activity.     Coordination: Coordination abnormal.     Comments: Left  pronator drift bilateral upper extrem, unable to hold left leg off bed for 5 seconds, sensation intact in all extremities, no visual field cuts, no left or right sided neglect, abnormal finger-nose exam left hand, no nystagmus noted      ED Results / Procedures / Treatments   Labs (all labs ordered are listed, but only abnormal results are displayed) Labs Reviewed  CBC - Abnormal; Notable for the following components:      Result Value   RBC 5.84 (*)    MCV 76.7 (*)    MCH 23.3 (*)    RDW 17.2 (*)    All other components within normal limits  DIFFERENTIAL - Abnormal; Notable for the following components:   Neutro Abs 9.6 (*)    Lymphs Abs 0.6 (*)    All other components within normal limits  COMPREHENSIVE METABOLIC PANEL - Abnormal; Notable for the following components:   CO2 21 (*)    Glucose, Bld 167 (*)    BUN 24 (*)    Creatinine, Ser 1.08 (*)    GFR calc non Af Amer 48 (*)    GFR calc Af Amer 55 (*)    All other components within normal limits  I-STAT CHEM 8, ED - Abnormal; Notable for the following components:   BUN 25 (*)    Glucose, Bld 157 (*)    All other components within normal limits  PROTIME-INR  APTT  CBG MONITORING, ED    EKG EKG Interpretation  Date/Time:  Friday August 12 2019 07:58:27 EST Ventricular Rate:  83 PR Interval:    QRS Duration: 81 QT Interval:  379 QTC Calculation: 446 R Axis:   41 Text Interpretation: Sinus rhythm Abnormal R-wave progression, early transition Nonspecific T abnormalities, lateral leads No old  tracing to compare Confirmed by Dorie Rank [phone]) on 08/12/2019 9:58:01 AM   Radiology CT Head Wo Contrast  Result Date: 08/12/2019 CLINICAL DATA:  Cerebral hemorrhage suspected. Additional provided: Numbness in hands, bilateral grips equal. Muffled speech. EXAM: CT HEAD WITHOUT CONTRAST TECHNIQUE:  Contiguous axial images were obtained from the base of the skull through the vertex without intravenous contrast. COMPARISON:  No pertinent prior studies available for comparison. FINDINGS: Brain: No evidence of acute intracranial hemorrhage. No evidence of acute demarcated cortical infarction. Chronic right PCA vascular territory cortically based infarct involving the right parietooccipital and posteromedial right temporal lobes. No evidence of intracranial mass. No midline shift or extra-axial fluid collection. Mild ill-defined hypoattenuation within the cerebral white matter is nonspecific, but consistent with chronic small vessel ischemic disease. Mild generalized parenchymal atrophy. Mineralization within the bilateral basal ganglia. Vascular: No hyperdense vessel.  Atherosclerotic calcifications. Skull: Normal. Negative for fracture or focal lesion. Sinuses/Orbits: Visualized orbits demonstrate no acute abnormality. Mild ethmoid sinus mucosal thickening. No significant mastoid effusion. IMPRESSION: No CT evidence of acute intracranial abnormality. Chronic right PCA vascular territory cortically based infarct. Mild generalized parenchymal atrophy and chronic small vessel ischemic disease. Electronically Signed   By: Jackey Loge DO   On: 08/12/2019 06:11   MR BRAIN WO CONTRAST  Result Date: 08/12/2019 CLINICAL DATA:  Focal neuro deficit, greater than 6 hours, stroke suspected. EXAM: MRI HEAD WITHOUT CONTRAST TECHNIQUE: Multiplanar, multiecho pulse sequences of the brain and surrounding structures were obtained without intravenous contrast. COMPARISON:  Head CT 08/12/2019 FINDINGS: Brain: Multiple sequences are  motion degraded. Most notably, there is moderate motion degradation of the axial T2 FLAIR sequence 12 x 10 mm focus of restricted diffusion within the right ventral pons consistent with acute infarct (series 5, image 67). Corresponding T2/FLAIR hyperintensity at this site. Redemonstrated chronic right PCA territory cortically based infarct. No intracranial mass is identified. No midline shift or extra-axial fluid collection. There are several supratentorial chronic microhemorrhages. Small chronic lacunar infarcts within the basal ganglia, thalami and cerebellum. Background of otherwise mild chronic small vessel ischemic disease. Moderate generalized parenchymal atrophy. Asymmetric T2/FLAIR hyperintensity within the left petrous apex. Vascular: Flow voids maintained within the proximal large arterial vessels. Skull and upper cervical spine: No focal marrow lesion Sinuses/Orbits: Bilateral lens replacements. Minimal ethmoid sinus mucosal thickening. No significant mastoid effusion. IMPRESSION: Motion degraded examination. 12 x 10 mm acute infarct within the right ventral pons. Redemonstrated chronic right PCA territory cortically based infarct. Multiple small chronic lacunar infarcts within the basal ganglia, thalami and cerebellum. Background of otherwise mild chronic small vessel ischemic disease. Moderate generalized parenchymal atrophy. Asymmetric T2 hyperintensity within the left petrous apex. This finding is nonspecific, but may reflect trapped fluid. Electronically Signed   By: Jackey Loge DO   On: 08/12/2019 09:43    Procedures Procedures (including critical care time)  Medications Ordered in ED Medications  sodium chloride flush (NS) 0.9 % injection 3 mL (has no administration in time range)  amLODipine (NORVASC) tablet 10 mg (10 mg Oral Given 08/12/19 0825)  hydrALAZINE (APRESOLINE) tablet 50 mg (50 mg Oral Given 08/12/19 0825)  hydrochlorothiazide (HYDRODIURIL) tablet 25 mg (25 mg Oral Given 08/12/19  0825)  LORazepam (ATIVAN) injection 1 mg (1 mg Intravenous Given 08/12/19 0836)    ED Course  I have reviewed the triage vital signs and the nursing notes.  Pertinent labs & imaging results that were available during my care of the patient were reviewed by me and considered in my medical decision making (see chart for details).  Clinical Course as of Aug 11 998  Fri Aug 12, 2019  7829 CT scan findings reviewed.  No acute stroke.   [JK]  [id number] Labs reviewed.  Mild increase in creatinine compared to baseline.  Blood pressure notably elevated.   [JK]  [id number] Exam concerning for stroke.  Left sided sx.  Prior stroke caused left sided deficits.  Pt states sx today are new.  Will mri to evaluate further.  Will give bp meds.   Pt is anxious about MRI.   Will give dose of ativan for mri   [JK]  3709 Discussed findings with the patient.  She is still somewhat sedated after her Ativan dose   [JK]    Clinical Course User Index [JK] Linwood Dibbles, MD   MDM Rules/Calculators/A&P               NIH Stroke Scale: 4       Patient presented to the emergency room with complaints of stroke symptoms.  Patient had been waiting for approximately 12 hours prior to my evaluation.  Patient states that symptoms started the morning prior to that.  Patient is outside TPA window.  CT scan did not show any acute findings but the MRA does demonstrate an acute stroke.  I will consult with neurology and the medical service for admission and further treatment.   Final Clinical Impression(s) / ED Diagnoses Final diagnoses:  Cerebral infarction, unspecified mechanism (HCC)  Hypertension, unspecified type      Linwood Dibbles, MD 08/12/19 1000

## 2019-08-12 NOTE — Consult Note (Addendum)
NEURO HOSPITALIST  CONSULT   Requesting Physician: Dr. Lorin Mercy    Chief Complaint: left side weakness  History obtained from:  Chart review  HPI:                                                                                                                                         Rhonda Golden is an 85 y.o. female  With PMH siginficant for HTN, HLD, CVA presented to [facility] ED for c/o 1 day history of weakness.  Per chart review: Patient stated that she noticed the weakness yesterday morning when she tried to get up. She has had a stroke in the past, but feels that the left side of her body is weaker than usual.  ED course:  CTH: no hemorrhage; chronic right PCA cortically based infarct. MRI: 12x10 mm acuter infarct in the right ventral pons. tPA Given: No: outside of window Last known well: The night of 08/10/2019  Past Medical History:  Diagnosis Date  . Hyperlipidemia   . Hypertension     History reviewed. No pertinent surgical history.  Family History  Problem Relation Age of Onset  . Hypertension Mother   . Hypertension Father          Social History:  reports that she has never smoked. She has never used smokeless tobacco. No history on file for alcohol and drug.  Allergies: No Known Allergies  Medications:                                                                                                                           Current Facility-Administered Medications  Medication Dose Route Frequency Provider Last Rate Last Admin  . amLODipine (NORVASC) tablet 10 mg  10 mg Oral Daily Dorie Rank, MD   10 mg at 08/12/19 0825  . hydrALAZINE (APRESOLINE) tablet 50 mg  50 mg Oral TID Dorie Rank, MD   50 mg at 08/12/19 0825  . hydrochlorothiazide (HYDRODIURIL) tablet 25 mg  25 mg Oral Daily Dorie Rank, MD   25 mg at 08/12/19 0825  . sodium chloride flush (NS) 0.9 %  injection 3 mL  3 mL Intravenous Once Linwood Dibbles, MD       Current  Outpatient Medications  Medication Sig Dispense Refill  . amLODipine (NORVASC) 10 MG tablet Take 1 tablet (10 mg total) by mouth daily. 90 tablet 2  . clopidogrel (PLAVIX) 75 MG tablet Take 1 tablet (75 mg total) by mouth daily. 90 tablet 2  . hydrALAZINE (APRESOLINE) 50 MG tablet Take 1 tablet (50 mg total) by mouth 3 (three) times daily. 270 tablet 2  . hydrochlorothiazide (HYDRODIURIL) 25 MG tablet Take 1 tablet (25 mg total) by mouth daily. 90 tablet 2  . metFORMIN (GLUCOPHAGE) 500 MG tablet Take 1 tablet (500 mg total) by mouth daily. 90 tablet 2  . predniSONE (DELTASONE) 20 MG tablet Take 2 tablets (40 mg total) by mouth daily with breakfast for 5 days. 10 tablet 0  . rosuvastatin (CRESTOR) 10 MG tablet Take 1 tablet (10 mg total) by mouth daily. 90 tablet 2   ROS:                                                                                                                                        unobtainable from patient due to mental status  General Examination:                                                                                                      Blood pressure (!) 194/75, pulse 73, temperature 97.8 F (36.6 C), temperature source Oral, resp. rate (!) 23, SpO2 99 %. Physical Exam  Constitutional: Appears well-developed and well-nourished.  Psych: Affect appropriate to situation Eyes: Normal external eye and conjunctiva. HENT: Normocephalic, no lesions, without obvious abnormality.   Musculoskeletal-no joint tenderness, deformity or swelling Cardiovascular: Normal rate and regular rhythm.  Respiratory: Effort normal, non-labored breathing saturations WNL GI: Soft.  No distension. There is no tenderness.  Skin: WDI  Neurological Examination Mental Status: Patient extremely drowsy after receiving ativan for MRI. On re-assessment. Patient awake, alert, combative. In bilateral mitten restraints. Not following commands. Cranial Nerves: II: blinks to threat  bilaterally III,IV, VI: ptosis not present, extra-ocular motions intact bilaterally, pupils equal, round, reactive to light  V,VII: left facial droop.   Motor/Sensory: LUE and LLE patient did not move, but will swat and kick with right side to noxious stimuli applied on the left side. RUE and RLE able to move anti-gravity with good strength. Deep Tendon Reflexes: 2+ and symmetric biceps and patella Cerebellar: No ataxia  noted with movements Gait: deferred   Lab Results: Basic Metabolic Panel: Recent Labs  Lab 08/11/19 1936 08/11/19 2055  NA 140 142  K 3.8 3.7  CL 106 108  CO2 21*  --   GLUCOSE 167* 157*  BUN 24* 25*  CREATININE 1.08* 1.00  CALCIUM 9.5  --     CBC: Recent Labs  Lab 08/11/19 1936 08/11/19 2055  WBC 10.3  --   NEUTROABS 9.6*  --   HGB 13.6 15.0  HCT 44.8 44.0  MCV 76.7*  --   PLT 311  --     Imaging: CT Head Wo Contrast  Result Date: 08/12/2019 CLINICAL DATA:  Cerebral hemorrhage suspected. Additional provided: Numbness in hands, bilateral grips equal. Muffled speech. EXAM: CT HEAD WITHOUT CONTRAST TECHNIQUE: Contiguous axial images were obtained from the base of the skull through the vertex without intravenous contrast. COMPARISON:  No pertinent prior studies available for comparison. FINDINGS: Brain: No evidence of acute intracranial hemorrhage. No evidence of acute demarcated cortical infarction. Chronic right PCA vascular territory cortically based infarct involving the right parietooccipital and posteromedial right temporal lobes. No evidence of intracranial mass. No midline shift or extra-axial fluid collection. Mild ill-defined hypoattenuation within the cerebral white matter is nonspecific, but consistent with chronic small vessel ischemic disease. Mild generalized parenchymal atrophy. Mineralization within the bilateral basal ganglia. Vascular: No hyperdense vessel.  Atherosclerotic calcifications. Skull: Normal. Negative for fracture or focal lesion.  Sinuses/Orbits: Visualized orbits demonstrate no acute abnormality. Mild ethmoid sinus mucosal thickening. No significant mastoid effusion. IMPRESSION: No CT evidence of acute intracranial abnormality. Chronic right PCA vascular territory cortically based infarct. Mild generalized parenchymal atrophy and chronic small vessel ischemic disease. Electronically Signed   By: Jackey Loge DO   On: 08/12/2019 06:11   MR BRAIN WO CONTRAST  Result Date: 08/12/2019 CLINICAL DATA:  Focal neuro deficit, greater than 6 hours, stroke suspected. EXAM: MRI HEAD WITHOUT CONTRAST TECHNIQUE: Multiplanar, multiecho pulse sequences of the brain and surrounding structures were obtained without intravenous contrast. COMPARISON:  Head CT 08/12/2019 FINDINGS: Brain: Multiple sequences are motion degraded. Most notably, there is moderate motion degradation of the axial T2 FLAIR sequence 12 x 10 mm focus of restricted diffusion within the right ventral pons consistent with acute infarct (series 5, image 67). Corresponding T2/FLAIR hyperintensity at this site. Redemonstrated chronic right PCA territory cortically based infarct. No intracranial mass is identified. No midline shift or extra-axial fluid collection. There are several supratentorial chronic microhemorrhages. Small chronic lacunar infarcts within the basal ganglia, thalami and cerebellum. Background of otherwise mild chronic small vessel ischemic disease. Moderate generalized parenchymal atrophy. Asymmetric T2/FLAIR hyperintensity within the left petrous apex. Vascular: Flow voids maintained within the proximal large arterial vessels. Skull and upper cervical spine: No focal marrow lesion Sinuses/Orbits: Bilateral lens replacements. Minimal ethmoid sinus mucosal thickening. No significant mastoid effusion. IMPRESSION: Motion degraded examination. 12 x 10 mm acute infarct within the right ventral pons. Redemonstrated chronic right PCA territory cortically based infarct. Multiple  small chronic lacunar infarcts within the basal ganglia, thalami and cerebellum. Background of otherwise mild chronic small vessel ischemic disease. Moderate generalized parenchymal atrophy. Asymmetric T2 hyperintensity within the left petrous apex. This finding is nonspecific, but may reflect trapped fluid. Electronically Signed   By: Jackey Loge DO   On: 08/12/2019 09:43     Valentina Lucks, MSN, NP-C [facility] [phone]  08/12/2019, 10:21 AM   Assessment: 85 y.o. female  With HTN and HLD presented to [facility] ED with  c/o increased left side weakness.  The CTH did not show a hemorrhage; chronic right PCA cortically based infarct. The MRI revealed new 12x10 mm acute infarct in the right ventral pons-looks like small vessel etiology but can not r/u embolic event.  Admit for further stroke work-up  Stroke Risk Factors - hyperlipidemia and hypertension     Recommendations:  --CTA head and neck --Echocardiogram -- High intensity Statin if LDL > 70 -- HgbA1c, fasting lipid panel -- PT consult, OT consult, Speech consult --Telemetry monitoring --Frequent neuro checks --Stroke swallow screen  --Currently on Plavix 75 daily. Continue Plavix for now. Further recommendations on antiplatelets after completion of work-up. --please page stroke NP  Or  PA  Or MD from 8am -4 pm  as this patient from this time will be  followed by the stroke.   You can look them up on [email or web address]  Password [facility]   Attending Neurohospitalist Addendum Patient seen and examined with APP/Resident. Agree with the history and physical as documented above. Agree with the plan as documented, which I helped formulate. I have independently reviewed the chart, obtained history, review of systems and examined the patient.I have personally reviewed pertinent head/neck/spine imaging (CT/MRI). Please feel free to call with any questions. --- Milon Dikes, MD [facility] Pager: [phone]  If 7pm  to 7am, please call on call as listed on AMION.   ADDENDUM  -Called by the hospitalist as patient has atrial fibrillation on monitor. Needed recommendations on anticoagulation and blood pressure management. -From a neurological standpoint, can do heparin stroke protocol for A. fib with RVR if needed. -Also, rate control per primary team.  Allow as much as permissive hypertension is can be allowed with rate control.  -- Milon Dikes, MD [facility] Pager: [phone] If 7pm to 7am, please call on call as listed on AMION.

## 2019-08-12 NOTE — ED Notes (Signed)
DAUGHTER, LUCILLE EVANS, CALLED FOR PT INFORMATION.[phone]

## 2019-08-12 NOTE — Progress Notes (Signed)
Since the time of H&P, the patient has begun to awaken some from the Ativan.  She has been combative and belligerent, attempting to bite, kick, and pinch staff.  This could be a result of the CVA, but is different than prior to receiving the Ativan and so is thought to be a reaction to the Ativan.  As such, she was given 2 mg IV Haldol with some improvement in her agitation.  She remains pretty somnolent when not being aggravated.  Meanwhile, she appears to have developed afib with RVR into the 120-130s.  With goal for permissive HTN, I am reluctant to start a Cardizem drip.  I have discussed the patient with Dr. Diona Browner.  He recommends IV Cardizem low-dose drip without bolus but is reluctant to use Amiodarone.  He also considers standing low-dose Lopressor to lower HR > BP.   There is also a question of whether to start IV Heparin.  I discussed with Dr. Wilford Corner.  Can start Cardizem for rate control and Heparin for AC, both without boluses.   Georgana Curio, M.D.

## 2019-08-12 NOTE — ED Notes (Signed)
Patient transported to MRI 

## 2019-08-12 NOTE — ED Notes (Signed)
Pt requested to go to the bathroom, staff went in to assist. Pt very agitated and becoming very combative. Pt using right hand and right leg to swing at this EMT and RN Katie. Mitts placed on pt.

## 2019-08-12 NOTE — H&P (Signed)
History and Physical    Rhonda Golden ZOX:[id number] DOB: 03-04-1935 DOA: 08/11/2019  PCP: Shelda Pal, DO Consultants:  None Patient coming from:  Home - lives with her son and daughter-in-law; [place]: Daughter-in-law, Posey Pronto Leda Quail?) Amalia Hailey, [phone]; daughter in [place], [place], [phone]  Chief Complaint: possible stroke  HPI: Rhonda Golden is a 85 y.o. female with medical history significant of HTN; HLD; DM; PAD s/p leg stents; and prior h/o CVA (2013) presenting with neurologic symptoms.  She is currently too somnolent (from Ativan) to provide history.  Upon my entry into the room, she did wake up briefly and was able to tell me that she was trying to get up; she then lapsed back into deep slumber.  Her daughter reports that Wednesday she noticed difficult with her L leg - she was diagnosed with a hamstring injury, given ice/heat, prednisone.  She developed difficulty walking and started dragging her foot/leg with her walker.  They noticed slurred speech yesterday, like she was too tired to talk.  +dysphagia - she coughed after swallowing.  She moved to [place] in 04/2019 and is living with her daughter.  Her daughter thinks she takes the Plavix every day.  Her daughter thought her left face was drooping last night.  After prior stroke, she had left eye field vision cut and mild left-sided weakness.  She was independent with ADLs after rehab.  When her daughter spoke to her yesterday, her speech had been slurred then and the day before - she thought she might be groggy from medication. She sounded normal this AM.  She reported that she was unable to walk due to left leg weakness.  Her BP was difficult to control in [place], refractory and never under good control.  Her DM was diagnosed in the last year or two.  She had PAD with leg stents in [place].  No known COVID contacts.   ED Course:  CVA.  Symptoms started yesterday AM, presented last PM and waited in the ER for many hours.  Prior CVA with  residual L-sided weakness.  MRI confirms acute pontine CVA.  Given Ativan for MRI, very sleepy.  Neurology is consulting.  Review of Systems: As per HPI; otherwise review of systems reviewed and negative.   Ambulatory Status:  Ambulates with a walker  Past Medical History:  Diagnosis Date  . CVA (cerebral vascular accident) ([place])    2013; 2021  . Diabetes mellitus ([place])   . Hyperlipidemia   . Hypertension   . PAD (peripheral artery disease) (HCC)    leg stents    History reviewed. No pertinent surgical history.  Social History   Socioeconomic History  . Marital status: Single    Spouse name: Not on file  . Number of children: Not on file  . Years of education: Not on file  . Highest education level: Not on file  Occupational History  . Not on file  Tobacco Use  . Smoking status: Never Smoker  . Smokeless tobacco: Never Used  Substance and Sexual Activity  . Alcohol use: Not on file  . Drug use: Not on file  . Sexual activity: Not on file  Other Topics Concern  . Not on file  Social History Narrative  . Not on file   Social Determinants of Health   Financial Resource Strain:   . Difficulty of Paying Living Expenses: Not on file  Food Insecurity:   . Worried About Charity fundraiser in the Last Year: Not on file  .  Ran Out of Food in the Last Year: Not on file  Transportation Needs:   . Lack of Transportation (Medical): Not on file  . Lack of Transportation (Non-Medical): Not on file  Physical Activity:   . Days of Exercise per Week: Not on file  . Minutes of Exercise per Session: Not on file  Stress:   . Feeling of Stress : Not on file  Social Connections:   . Frequency of Communication with Friends and Family: Not on file  . Frequency of Social Gatherings with Friends and Family: Not on file  . Attends Religious Services: Not on file  . Active Member of Clubs or Organizations: Not on file  . Attends Banker Meetings: Not on file  . Marital  Status: Not on file  Intimate Partner Violence:   . Fear of Current or Ex-Partner: Not on file  . Emotionally Abused: Not on file  . Physically Abused: Not on file  . Sexually Abused: Not on file    No Known Allergies  Family History  Problem Relation Age of Onset  . Hypertension Mother   . Hypertension Father     Prior to Admission medications   Medication Sig Start Date End Date Taking? Authorizing Provider  amLODipine (NORVASC) 10 MG tablet Take 1 tablet (10 mg total) by mouth daily. 05/18/19   Sharlene Dory, DO  clopidogrel (PLAVIX) 75 MG tablet Take 1 tablet (75 mg total) by mouth daily. 05/18/19   Sharlene Dory, DO  hydrALAZINE (APRESOLINE) 50 MG tablet Take 1 tablet (50 mg total) by mouth 3 (three) times daily. 05/18/19   Sharlene Dory, DO  hydrochlorothiazide (HYDRODIURIL) 25 MG tablet Take 1 tablet (25 mg total) by mouth daily. 05/18/19   Sharlene Dory, DO  metFORMIN (GLUCOPHAGE) 500 MG tablet Take 1 tablet (500 mg total) by mouth daily. 05/18/19   Sharlene Dory, DO  predniSONE (DELTASONE) 20 MG tablet Take 2 tablets (40 mg total) by mouth daily with breakfast for 5 days. 08/10/19 08/15/19  Sharlene Dory, DO  rosuvastatin (CRESTOR) 10 MG tablet Take 1 tablet (10 mg total) by mouth daily. 05/18/19   Sharlene Dory, DO    Physical Exam: Vitals:   08/12/19 0824 08/12/19 1037 08/12/19 1100 08/12/19 1130  BP: (!) 194/75 (!) 185/58 (!) 180/60 (!) 162/76  Pulse: 73 62 62 67  Resp: (!) 23 16 18 18   Temp:      TempSrc:      SpO2: 99% 99% 99% 98%     . General:  Appears somnolent and is NAD; left facial droop is noted at rest . Eyes:  PERRL, normal lids, iris . ENT:  grossly normal hearing, lips & tongue, mmm . Neck:  no LAD, masses or thyromegaly; no carotid bruits . Cardiovascular:  RRR, no m/r/g. No LE edema.  Respiratory:   CTA bilaterally with no wheezes/rales/rhonchi.  Normal respiratory  effort. . Abdomen:  soft, NT, ND, NABS . Skin:  no rash or induration seen on limited exam . Musculoskeletal:   no bony abnormality; apparent left hemiparesis but unable to tell new vs. Old or the extent of weakness given sedation . Psychiatric:  Excessively somnolent from sedation . Neurologic:  Unable to effectively perform at this time - although there is left facial droop and left hemiparesis to some extent visible with limited exam    Radiological Exams on Admission: CT Head Wo Contrast  Result Date: 08/12/2019 CLINICAL DATA:  Cerebral hemorrhage  suspected. Additional provided: Numbness in hands, bilateral grips equal. Muffled speech. EXAM: CT HEAD WITHOUT CONTRAST TECHNIQUE: Contiguous axial images were obtained from the base of the skull through the vertex without intravenous contrast. COMPARISON:  No pertinent prior studies available for comparison. FINDINGS: Brain: No evidence of acute intracranial hemorrhage. No evidence of acute demarcated cortical infarction. Chronic right PCA vascular territory cortically based infarct involving the right parietooccipital and posteromedial right temporal lobes. No evidence of intracranial mass. No midline shift or extra-axial fluid collection. Mild ill-defined hypoattenuation within the cerebral white matter is nonspecific, but consistent with chronic small vessel ischemic disease. Mild generalized parenchymal atrophy. Mineralization within the bilateral basal ganglia. Vascular: No hyperdense vessel.  Atherosclerotic calcifications. Skull: Normal. Negative for fracture or focal lesion. Sinuses/Orbits: Visualized orbits demonstrate no acute abnormality. Mild ethmoid sinus mucosal thickening. No significant mastoid effusion. IMPRESSION: No CT evidence of acute intracranial abnormality. Chronic right PCA vascular territory cortically based infarct. Mild generalized parenchymal atrophy and chronic small vessel ischemic disease. Electronically Signed   By: Jackey Loge DO   On: 08/12/2019 06:11   MR BRAIN WO CONTRAST  Result Date: 08/12/2019 CLINICAL DATA:  Focal neuro deficit, greater than 6 hours, stroke suspected. EXAM: MRI HEAD WITHOUT CONTRAST TECHNIQUE: Multiplanar, multiecho pulse sequences of the brain and surrounding structures were obtained without intravenous contrast. COMPARISON:  Head CT 08/12/2019 FINDINGS: Brain: Multiple sequences are motion degraded. Most notably, there is moderate motion degradation of the axial T2 FLAIR sequence 12 x 10 mm focus of restricted diffusion within the right ventral pons consistent with acute infarct (series 5, image 67). Corresponding T2/FLAIR hyperintensity at this site. Redemonstrated chronic right PCA territory cortically based infarct. No intracranial mass is identified. No midline shift or extra-axial fluid collection. There are several supratentorial chronic microhemorrhages. Small chronic lacunar infarcts within the basal ganglia, thalami and cerebellum. Background of otherwise mild chronic small vessel ischemic disease. Moderate generalized parenchymal atrophy. Asymmetric T2/FLAIR hyperintensity within the left petrous apex. Vascular: Flow voids maintained within the proximal large arterial vessels. Skull and upper cervical spine: No focal marrow lesion Sinuses/Orbits: Bilateral lens replacements. Minimal ethmoid sinus mucosal thickening. No significant mastoid effusion. IMPRESSION: Motion degraded examination. 12 x 10 mm acute infarct within the right ventral pons. Redemonstrated chronic right PCA territory cortically based infarct. Multiple small chronic lacunar infarcts within the basal ganglia, thalami and cerebellum. Background of otherwise mild chronic small vessel ischemic disease. Moderate generalized parenchymal atrophy. Asymmetric T2 hyperintensity within the left petrous apex. This finding is nonspecific, but may reflect trapped fluid. Electronically Signed   By: Jackey Loge DO   On: 08/12/2019 09:43     EKG: Independently reviewed.  NSR with rate 83; nonspecific ST changes with no evidence of acute ischemia   Labs on Admission: I have personally reviewed the available labs and imaging studies at the time of the admission.  Pertinent labs:   BUN 24/Creatinine 1.08/GFR 48 WBC 10.3 INR 1.0  Assessment/Plan Principal Problem:   Right pontine CVA (HCC) Active Problems:   Diabetes mellitus type 2 in nonobese (HCC)   PAD (peripheral artery disease) (HCC)   Hyperlipidemia   Essential hypertension    CVA -Patient with prior h/o CVA presenting with several days of worsening left-sided weakness - initially her leg only and it was thought to be associated with a hamstring injury for which she was given steroids but it progressed to the arm and face with slurred speech and possible dysphagia as well -MRI confirms CVA in  the R pons, acute -Will admit for further CVA evaluation -Telemetry monitoring -Carotid dopplers -Echo -Risk stratification with FLP; will also check TSH -ASA daily added in addition to Plavix (daughter reports compliance with this medication at home) -Neurology consult -PT/OT/ST/Nutrition Consults -SW consult for placement  HTN -Allow permissive HTN for now -Treat BP only if >220/120, and then with goal of 15% reduction -Hold CCB, hydralazine, and HCTZ and plan to restart in 48-72 hours   HLD -Check FLP -Continue Crestor 10 mg daily for now   DM -Recent A1c shows reasonably good control (6.5) -Hold home PO medication (Glucophage) -Will order moderate-scale SSI  PAD -s/p stents while in [place]    Note: This patient has been tested and is pending for the novel coronavirus COVID-19.   Note: The patient recently moved to [place] from her home in [place] and is living with her daughter-in-law (whose husband, the patient's son, died and who is remarried).  Her daughter is the primary decision-maker but is in [place] and will be unable to come here.  There is shared  communication between the daughter and daughter-in-law.   DVT prophylaxis:  Lovenox  Code Status: Full - confirmed with family; note: the patient's daughter thinks that the patient wants to be DNR but isn't comfortable making this decision at this time and so asks that it be revisited when her mother is alert and communicative Family Communication: None present; I spoke with the patient's daughter and daughter-in-law by telephone Disposition Plan:  To be determined Consults called: Neurology; PT/OT/ST/Nutrition  Admission status: Admit - It is my clinical opinion that admission to INPATIENT is reasonable and necessary because of the expectation that this patient will require hospital care that crosses at least 2 midnights to treat this condition based on the medical complexity of the problems presented.  Given the aforementioned information, the predictability of an adverse outcome is felt to be significant.     Jonah Blue MD [facility]   How to contact the [facility] Attending or Consulting provider 7A - 7P or covering provider during after hours 7P -7A, for this patient?  1. Check the care team in [facility] and look for a) attending/consulting [facility] provider listed and b) the [facility] team listed 2. Log into [email or web address] and use [place]'s universal password to access. If you do not have the password, please contact the hospital operator. 3. Locate the [facility] provider you are looking for under [facility] and page to a number that you can be directly reached. 4. If you still have difficulty reaching the provider, please page the [facility] (Director on Call) for the Hospitalists listed on amion for assistance.   08/12/2019, 11:48 AM

## 2019-08-12 NOTE — ED Notes (Signed)
While swabbing patient for Covid, patient swung at RN with right arm and right leg. Patient becoming agitated with staff.

## 2019-08-12 NOTE — Progress Notes (Signed)
ANTICOAGULATION CONSULT NOTE - Initial Consult  Pharmacy Consult for heparin Indication: atrial fibrillation and stroke  No Known Allergies  Patient Measurements:   Heparin Dosing Weight: 68kg  Vital Signs: Temp: 97.8 F (36.6 C) (01/01 0553) Temp Source: Oral (01/01 0553) BP: 154/99 (01/01 1330) Pulse Rate: 119 (01/01 1330)  Labs: Recent Labs    08/11/19 1936 08/11/19 2055  HGB 13.6 15.0  HCT 44.8 44.0  PLT 311  --   APTT 26  --   LABPROT 12.8  --   INR 1.0  --   CREATININE 1.08* 1.00    Estimated Creatinine Clearance: 41.1 mL/min (by C-G formula based on SCr of 1 mg/dL).   Medical History: Past Medical History:  Diagnosis Date  . CVA (cerebral vascular accident) (HCC)    2013; 2021  . Diabetes mellitus (HCC)   . Hyperlipidemia   . Hypertension   . PAD (peripheral artery disease) (HCC)    leg stents    Medications:  Infusions:  . sodium chloride 50 mL/hr at 08/12/19 1341  . diltiazem (CARDIZEM) infusion    . heparin 900 Units/hr (08/12/19 1458)    Assessment: 38 yof presented to the ED with concerns for a stroke. MRI reported with an acute stroke. Also found to be in afib. To start IV heparin. Baseline CBC is WNL. She is not on anticoagulation PTA.   Goal of Therapy:  Heparin level 0.3-0.5 units/ml Monitor platelets by anticoagulation protocol: Yes   Plan:  Heparin gtt 900 units/hr - NO BOLUS Check an 8 hr heparin level Daily heparin level and CBC  Horald Birky, Drake Leach 08/12/2019,2:45 PM

## 2019-08-12 NOTE — Progress Notes (Signed)
ANTICOAGULATION CONSULT NOTE   Pharmacy Consult for Heparin Indication: atrial fibrillation and stroke  No Known Allergies  Patient Measurements:   Heparin Dosing Weight: 68kg  Vital Signs: Temp: 98.2 F (36.8 C) (01/01 2004) Temp Source: Oral (01/01 2004) BP: 125/67 (01/01 2114) Pulse Rate: 92 (01/01 2004)  Labs: Recent Labs    08/11/19 1936 08/11/19 2055 08/12/19 2256  HGB 13.6 15.0  --   HCT 44.8 44.0  --   PLT 311  --   --   APTT 26  --   --   LABPROT 12.8  --   --   INR 1.0  --   --   HEPARINUNFRC  --   --  0.25*  CREATININE 1.08* 1.00  --     Estimated Creatinine Clearance: 41.1 mL/min (by C-G formula based on SCr of 1 mg/dL).   Medical History: Past Medical History:  Diagnosis Date  . CVA (cerebral vascular accident) (HCC)    2013; 2021  . Diabetes mellitus (HCC)   . Hyperlipidemia   . Hypertension   . PAD (peripheral artery disease) (HCC)    leg stents    Medications:  Infusions:  . sodium chloride 50 mL/hr at 08/12/19 2252  . diltiazem (CARDIZEM) infusion Stopped (08/12/19 2148)  . heparin 900 Units/hr (08/12/19 1458)    Assessment: 71 yof presented to the ED with concerns for a stroke. MRI reported with an acute stroke. Also found to be in afib. To start IV heparin. Baseline CBC is WNL. She is not on anticoagulation PTA.   1/1 PM update:  Heparin level just below goal No issues per RN  Goal of Therapy:  Heparin level 0.3-0.5 units/ml Monitor platelets by anticoagulation protocol: Yes   Plan:  No boluses  Inc heparin to 1000 units/hr Re-check heparin level with AM labs Daily heparin level and CBC  Abran Duke, PharmD, BCPS Clinical Pharmacist Phone: [phone]

## 2019-08-12 NOTE — Progress Notes (Signed)
Echocardiogram 2D Echocardiogram has been performed.  Rhonda Golden 08/12/2019, 3:30 PM

## 2019-08-12 NOTE — Progress Notes (Signed)
Patient admitted to [address], alert oriented x 4, no c/o pain. She is on heparin drip and NS. Patient is in bed resting comfortably in bed locked at lower level. Will continue to monitor patient.

## 2019-08-12 NOTE — Progress Notes (Signed)
Met pt with Iv Cardizem infusing at 10cc/hr, HR ranges blw 100-120s, with Afib, pt's HR observed to have dropped to the 50s and 60s at 2130, now in [facility] (on call) paged and notified, Cardizem drip stopped at 2148 as ordered, pt calm in bed, was however reassured and will continue to monitor. Obasogie-Asidi, Penina Reisner Efe

## 2019-08-12 NOTE — ED Notes (Signed)
Echo at bedside

## 2019-08-13 ENCOUNTER — Encounter (HOSPITAL_COMMUNITY): Payer: Medicare HMO

## 2019-08-13 ENCOUNTER — Inpatient Hospital Stay (HOSPITAL_COMMUNITY): Payer: Medicare HMO

## 2019-08-13 DIAGNOSIS — E785 Hyperlipidemia, unspecified: Secondary | ICD-10-CM

## 2019-08-13 DIAGNOSIS — I10 Essential (primary) hypertension: Secondary | ICD-10-CM

## 2019-08-13 DIAGNOSIS — I6350 Cerebral infarction due to unspecified occlusion or stenosis of unspecified cerebral artery: Secondary | ICD-10-CM

## 2019-08-13 DIAGNOSIS — I480 Paroxysmal atrial fibrillation: Secondary | ICD-10-CM

## 2019-08-13 DIAGNOSIS — E119 Type 2 diabetes mellitus without complications: Secondary | ICD-10-CM

## 2019-08-13 DIAGNOSIS — I739 Peripheral vascular disease, unspecified: Secondary | ICD-10-CM

## 2019-08-13 LAB — CBC
HCT: 39.1 % (ref 36.0–46.0)
Hemoglobin: 12.3 g/dL (ref 12.0–15.0)
MCH: 23.6 pg — ABNORMAL LOW (ref 26.0–34.0)
MCHC: 31.5 g/dL (ref 30.0–36.0)
MCV: 74.9 fL — ABNORMAL LOW (ref 80.0–100.0)
Platelets: 275 10*3/uL (ref 150–400)
RBC: 5.22 MIL/uL — ABNORMAL HIGH (ref 3.87–5.11)
RDW: 16.8 % — ABNORMAL HIGH (ref 11.5–15.5)
WBC: 9.7 10*3/uL (ref 4.0–10.5)
nRBC: 0 % (ref 0.0–0.2)

## 2019-08-13 LAB — LIPID PANEL
Cholesterol: 135 mg/dL (ref 0–200)
HDL: 61 mg/dL (ref >40)
LDL Cholesterol: 60 mg/dL (ref 0–99)
Total CHOL/HDL Ratio: 2.2 RATIO
Triglycerides: 71 mg/dL (ref <150)
VLDL: 14 mg/dL (ref 0–40)

## 2019-08-13 LAB — GLUCOSE, CAPILLARY
Glucose-Capillary: 96 mg/dL (ref 70–99)
Glucose-Capillary: 131 mg/dL — ABNORMAL HIGH (ref 70–99)
Glucose-Capillary: 185 mg/dL — ABNORMAL HIGH (ref 70–99)
Glucose-Capillary: 74 mg/dL (ref 70–99)

## 2019-08-13 LAB — HEMOGLOBIN A1C
Hgb A1c MFr Bld: 6.2 % — ABNORMAL HIGH (ref 4.8–5.6)
Mean Plasma Glucose: 131.24 mg/dL

## 2019-08-13 LAB — HEPARIN LEVEL (UNFRACTIONATED): Heparin Unfractionated: 0.55 IU/mL (ref 0.30–0.70)

## 2019-08-13 MED ORDER — ENSURE ENLIVE PO LIQD
237.0000 mL | Freq: Two times a day (BID) | ORAL | Status: DC
  Administered 2019-08-13 – 2019-08-18 (×10): 237 mL via ORAL

## 2019-08-13 MED ORDER — LABETALOL HCL 5 MG/ML IV SOLN
5.0000 mg | Freq: Once | INTRAVENOUS | Status: AC | PRN
  Administered 2019-08-13: 03:00:00 5 mg via INTRAVENOUS
  Filled 2019-08-13: qty 4

## 2019-08-13 MED ORDER — CHLORHEXIDINE GLUCONATE 0.12 % MT SOLN
15.0000 mL | Freq: Two times a day (BID) | OROMUCOSAL | Status: DC
  Administered 2019-08-13 – 2019-08-18 (×11): 15 mL via OROMUCOSAL
  Filled 2019-08-13 (×7): qty 15

## 2019-08-13 NOTE — Progress Notes (Signed)
Rehab Admissions Coordinator Note:  Per PT recommendation, patient was screened by Stephania Fragmin for appropriateness for an Inpatient Acute Rehab Consult.  At this time, we are recommending Inpatient Rehab consult.  I will place a consult order so we can evaluate the patient on Monday.   Stephania Fragmin 08/13/2019, 10:46 AM  I can be reached at [phone].

## 2019-08-13 NOTE — Progress Notes (Signed)
ANTICOAGULATION CONSULT NOTE - Follow Up Consult  Pharmacy Consult for Heparin Indication: stroke, CVA  No Known Allergies  Patient Measurements: Height: 5\' 4"  (162.6 cm) Weight: 149 lb 14.6 oz (68 kg) IBW/kg (Calculated) : 54.7 Heparin Dosing Weight:   Vital Signs: Temp: 98.5 F (36.9 C) (01/02 0400) Temp Source: Oral (01/02 0400) BP: 168/61 (01/02 0444) Pulse Rate: 59 (01/02 0444)  Labs: Recent Labs    08/11/19 1936 08/11/19 2055 08/12/19 2256 08/13/19 0257  HGB 13.6 15.0  --  12.3  HCT 44.8 44.0  --  39.1  PLT 311  --   --  275  APTT 26  --   --   --   LABPROT 12.8  --   --   --   INR 1.0  --   --   --   HEPARINUNFRC  --   --  0.25* 0.55  CREATININE 1.08* 1.00  --   --     Estimated Creatinine Clearance: 41.1 mL/min (by C-G formula based on SCr of 1 mg/dL).  Assessment:  Anticoag: Heparin for afib, acute stroke - CBC WNL, no AC PTA. HL 0.55 (goal 0.3-0.5). Hgb 12.3, Plts 275 ok.   Goal of Therapy:  Heparin level 0.3-0.5 units/ml Monitor platelets by anticoagulation protocol: Yes   Plan:  Con't IV heparin at 1000 units/hr - NO BOLUS, low goal Daily HL and CBC Many home meds not resumed yet   Aleksandr Pellow S. 10/11/19, PharmD, BCPS Clinical Staff Pharmacist [email or web address] Merilynn Finland, Merilynn Finland 08/13/2019,8:39 AM

## 2019-08-13 NOTE — Evaluation (Addendum)
Physical Therapy Evaluation Patient Details Name: Rhonda Golden MRN: [id number] DOB: [birth year] Today's Date: 08/13/2019   History of Present Illness  Rhonda Golden is a 85 y.o. female with medical history significant of HTN; HLD; DM; PAD s/p leg stents; and prior h/o CVA (2013) presenting with left lower extremity weakness and slurred speech.  MRI on 08/12/19 revealed "12 x 10 mm acute infarct within the right ventral pons."   Clinical Impression  Pt admitted with above. Prior to admission, pt lives with her daughter in law, uses a cane vs a walker for mobility, and is independent with ADL's. On PT evaluation, pt presents with left hemiplegia, lower extremity extensor tone, decreased coordination, balance impairments. Pt requiring maximal assist for bed mobility and two person moderate assist for transfers and pre gait training. Recommend CIR based on pt PLOF and motivation to address deficits and maximize functional independence.     Follow Up Recommendations CIR    Equipment Recommendations  Wheelchair (measurements PT);3in1 (PT)    Recommendations for Other Services Rehab consult     Precautions / Restrictions Precautions Precautions: Fall;Other (comment) Precaution Comments: Left hemiplegia; LLE extensor tone Restrictions Weight Bearing Restrictions: No      Mobility  Bed Mobility Overal bed mobility: Needs Assistance Bed Mobility: Sit to Supine;Supine to Sit     Supine to sit: Max assist Sit to supine: Max assist   General bed mobility comments: Cues for hooking RLE under LLE to progress off edge of bed and reaching for left railing; maxA for trunk elevation to upright and use of bed pad to scoot hips forward to edge of bed. MaxA for leg negotiation back into bed and to reposition.  Transfers Overall transfer level: Needs assistance Equipment used: Rolling walker (2 wheeled) Transfers: Sit to/from Stand Sit to Stand: Mod assist;+2 physical assistance         General  transfer comment: ModA + 2 to stand from edge of bed with good initiation; cues for hand placement (left hand placed on walker and right hand pushed from bed). Pre gait training including weight shifting and side steps. Pt with decreased ability to weight shift, erratic placement of left foot, increased time/effort for sequencing. Tactile cues provided for decreased left knee hyperextension.   Ambulation/Gait                Stairs            Wheelchair Mobility    Modified Rankin (Stroke Patients Only) Modified Rankin (Stroke Patients Only) Pre-Morbid Rankin Score: Slight disability Modified Rankin: Moderately severe disability     Balance Overall balance assessment: Needs assistance Sitting-balance support: Feet unsupported Sitting balance-Leahy Scale: Good Sitting balance - Comments: able to sit unsupported   Standing balance support: Bilateral upper extremity supported Standing balance-Leahy Scale: Poor Standing balance comment: reliant on external support                             Pertinent Vitals/Pain Pain Assessment: No/denies pain Faces Pain Scale: No hurt    Home Living Family/patient expects to be discharged to:: Private residence Living Arrangements: Children(daughter in law) Available Help at Discharge: Family;Available PRN/intermittently Type of Home: House Home Access: Stairs to enter   Entrance Stairs-Number of Steps: 3 Home Layout: One level Home Equipment: Walker - 2 wheels;Cane - single point      Prior Function Level of Independence: Independent with assistive device(s)  Comments: Uses cane and RW for mobility. Performs BADLs and some IADLs including cooking and medication management     Hand Dominance   Dominant Hand: Right    Extremity/Trunk Assessment   Upper Extremity Assessment Upper Extremity Assessment: Defer to OT evaluation    Lower Extremity Assessment Lower Extremity Assessment: RLE  deficits/detail;LLE deficits/detail RLE Deficits / Details: WFL LLE Deficits / Details: Knee extension 3/5, ankle dorsiflexion 1/5, increased extensor tone LLE Coordination: decreased gross motor;decreased fine motor       Communication   Communication: Expressive difficulties(slurred speech)  Cognition Arousal/Alertness: Awake/alert Behavior During Therapy: WFL for tasks assessed/performed Overall Cognitive Status: Impaired/Different from baseline Area of Impairment: Memory;Problem solving;Attention                   Current Attention Level: Sustained Memory: Decreased short-term memory       Problem Solving: Difficulty sequencing;Requires verbal cues General Comments: Pt A&Ox4, mild slurred speech, requires cues for problem solving with mobility tasks.      General Comments  Vitals: (RN/MD aware) BP pre mobility: 192/78 BP post mobility: 166/127 (unsure of accuracy) BP x 2 minutes post mobility: 182/57    Exercises     Assessment/Plan    PT Assessment Patient needs continued PT services  PT Problem List Decreased strength;Decreased range of motion;Decreased activity tolerance;Decreased balance;Decreased mobility;Decreased coordination;Decreased cognition       PT Treatment Interventions DME instruction;Gait training;Functional mobility training;Therapeutic activities;Stair training;Therapeutic exercise;Balance training;Patient/family education    PT Goals (Current goals can be found in the Care Plan section)  Acute Rehab PT Goals Patient Stated Goal: get stronger PT Goal Formulation: With patient Time For Goal Achievement: 08/27/19 Potential to Achieve Goals: Good    Frequency Min 4X/week   Barriers to discharge        Co-evaluation PT/OT/SLP Co-Evaluation/Treatment: Yes Reason for Co-Treatment: Complexity of the patient's impairments (multi-system involvement);For patient/therapist safety;To address functional/ADL transfers PT goals addressed during  session: Mobility/safety with mobility         AM-PAC PT "6 Clicks" Mobility  Outcome Measure Help needed turning from your back to your side while in a flat bed without using bedrails?: A Lot Help needed moving from lying on your back to sitting on the side of a flat bed without using bedrails?: Total Help needed moving to and from a bed to a chair (including a wheelchair)?: A Lot Help needed standing up from a chair using your arms (e.g., wheelchair or bedside chair)?: A Lot Help needed to walk in hospital room?: Total Help needed climbing 3-5 steps with a railing? : Total 6 Click Score: 9    End of Session Equipment Utilized During Treatment: Gait belt Activity Tolerance: Patient tolerated treatment well Patient left: in bed;with call bell/phone within reach;with bed alarm set;with nursing/sitter in room Nurse Communication: Mobility status PT Visit Diagnosis: Unsteadiness on feet (R26.81);Other abnormalities of gait and mobility (R26.89);Difficulty in walking, not elsewhere classified (R26.2);Other symptoms and signs involving the nervous system (R29.898);Hemiplegia and hemiparesis Hemiplegia - Right/Left: Left Hemiplegia - dominant/non-dominant: Non-dominant Hemiplegia - caused by: Cerebral infarction    Time: 1884-1660 PT Time Calculation (min) (ACUTE ONLY): 29 min   Charges:   PT Evaluation $PT Eval Moderate Complexity: 1 Mod          Rhonda Golden, [place], DPT Acute Rehabilitation Services Pager [phone] Office [phone]   Rhonda Golden 08/13/2019, 9:43 AM

## 2019-08-13 NOTE — Progress Notes (Addendum)
PROGRESS NOTE    Rhonda Golden  WHQ:[id number] DOB: April 19, 1935 DOA: 08/11/2019 PCP: Shelda Pal, DO   Brief Narrative: Rhonda Golden is a 85 y.o. female with medical history significant of HTN; HLD; DM; PAD s/p leg stents. Patient presented secondary to concern for stroke and found to have an acute stroke. While admitted, she had an episode of atrial fibrillation with RVR managed on diltiazem drip and started on heparin drip. Neurology on board.   Assessment & Plan:   Principal Problem:   Right pontine CVA ([place]) Active Problems:   Diabetes mellitus type 2 in nonobese [facility])   PAD (peripheral artery disease) ([place])   Hyperlipidemia   Essential hypertension   Acute stroke MRI significant for 1.2 x 1 cm right ventral pontine stroke. LDL of 60. Previous hemoglobin A1C (05/18/19) of 6.5%. Transthoracic Echocardiogram significant for normal EF and no embolic source (poor image quality). Atrial fibrillation captured on telemetry. Started on heparin drip. -Neurology recommendations: CTA head/neck, continue Plavix -PT/OT/SLP recommendations  Paroxysmal atrial fibrillation Newly diagnosed. Found to have a rhythm consisted with atrial fibrillation and rates as high as 120-130s. Started on Diltiazem drip without bolus. Rhythm currently sinus with controlled rates. Transthoracic Echocardiogram obtained and significant for normal EF and mild left atrium dilation. -Continue diltiazem drip -Consult cardiology  Delirium/agitation Patient given Ativan and Haldol as an outpatient.  Essential hypertension Patient is on amlodipine and hydrochlorothiazide as an outpatient. In setting of stroke, neurology recommending permissive hypertension. Diltiazem started as mentioned above. -Continue diltiazem as mentioned above -Permissive hypertension per neurology recommendations  Hyperlipidemia On Crestor 10 mg as an outpatient. LDL of 60 and is at goal. -Continue Crestor  PAD Per chart review,  patient is s/p stents in right leg in [place]; unsure of exact procedure. Patient is on Plavix as an outpatient.   DVT prophylaxis: Heparin drip Code Status:   Code Status: Full Code Family Communication: None Disposition Plan: Discharge pending stroke workup, management of afib w/ RVR, PT/OT/SLP recommendations   Consultants:   Neurology  Cardiology  Procedures:   08/12/2019: Transthoracic Echocardiogram IMPRESSIONS    1. Left ventricular ejection fraction, by visual estimation, is 60 to 65%. The left ventricle has normal function. Left ventricular septal wall thickness was normal. There is mildly increased left ventricular hypertrophy.  2. Left ventricular diastolic function could not be evaluated.  3. Global right ventricle was not well visualized.The right ventricular size is not well visualized. Right vetricular wall thickness was not assessed.  4. Left atrial size was mildly dilated.  5. Right atrial size was normal.  6. The mitral valve was not well visualized. No evidence of mitral valve regurgitation.  7. The tricuspid valve is not well visualized.  8. The aortic valve was not well visualized. Aortic valve regurgitation is not visualized. Mild aortic valve sclerosis without stenosis.  9. The pulmonic valve was not well visualized. Pulmonic valve regurgitation is not visualized. 10. Technically difficult echo. Poor image quality. 11. The atrial septum is grossly normal.  Antimicrobials:  None    Subjective: No issues. She vaguely remembers agitation yesterday.  Objective: Vitals:   08/13/19 0236 08/13/19 0256 08/13/19 0400 08/13/19 0444  BP: (!) 199/58 (!) 154/60 (!) 173/57 (!) 168/61  Pulse: 77 60 64 (!) 59  Resp: 18 18 16 16   Temp:   98.5 F (36.9 C)   TempSrc:   Oral   SpO2: 96% 95% 98% 97%  Weight:      Height:  Intake/Output Summary (Last 24 hours) at 08/13/2019 0756 Last data filed at 08/13/2019 4580 Gross per 24 hour  Intake 1002.06 ml    Output 825 ml  Net 177.06 ml   Filed Weights   08/12/19 2300  Weight: 68 kg    Examination:  General exam: Appears calm and comfortable Respiratory system: Clear to auscultation. Respiratory effort normal. Cardiovascular system: S1 & S2 heard, RRR. No murmurs, rubs, gallops or clicks. Gastrointestinal system: Abdomen is nondistended, soft and nontender. No organomegaly or masses felt. Normal bowel sounds heard. Central nervous system: Alert and oriented. Left facial droop with dysarthria. Shoulder strength is about 3/5 with 1-2/5 strength in raising her forearm. 4/5 left grip strength. 3/5 left LE strength Extremities: No edema. No calf tenderness Skin: No cyanosis. No rashes Psychiatry: Judgement and insight appear normal. Mood & affect appropriate.     Data Reviewed: I have personally reviewed following labs and imaging studies  CBC: Recent Labs  Lab 08/11/19 1936 08/11/19 2055 08/13/19 0257  WBC 10.3  --  9.7  NEUTROABS 9.6*  --   --   HGB 13.6 15.0 12.3  HCT 44.8 44.0 39.1  MCV 76.7*  --  74.9*  PLT 311  --  275   Basic Metabolic Panel: Recent Labs  Lab 08/11/19 1936 08/11/19 2055  NA 140 142  K 3.8 3.7  CL 106 108  CO2 21*  --   GLUCOSE 167* 157*  BUN 24* 25*  CREATININE 1.08* 1.00  CALCIUM 9.5  --    GFR: Estimated Creatinine Clearance: 41.1 mL/min (by C-G formula based on SCr of 1 mg/dL). Liver Function Tests: Recent Labs  Lab 08/11/19 1936  AST 19  ALT 14  ALKPHOS 97  BILITOT 0.3  PROT 7.9  ALBUMIN 3.9   No results for input(s): LIPASE, AMYLASE in the last 168 hours. No results for input(s): AMMONIA in the last 168 hours. Coagulation Profile: Recent Labs  Lab 08/11/19 1936  INR 1.0   Cardiac Enzymes: No results for input(s): CKTOTAL, CKMB, CKMBINDEX, TROPONINI in the last 168 hours. BNP (last 3 results) No results for input(s): PROBNP in the last 8760 hours. HbA1C: No results for input(s): HGBA1C in the last 72 hours. CBG: Recent  Labs  Lab 08/12/19 1339 08/12/19 1650 08/12/19 2031 08/12/19 2300 08/13/19 0614  GLUCAP 130* 102* 104* 122* 96   Lipid Profile: Recent Labs    08/13/19 0300  CHOL 135  HDL 61  LDLCALC 60  TRIG 71  CHOLHDL 2.2   Thyroid Function Tests: No results for input(s): TSH, T4TOTAL, FREET4, T3FREE, THYROIDAB in the last 72 hours. Anemia Panel: No results for input(s): VITAMINB12, FOLATE, FERRITIN, TIBC, IRON, RETICCTPCT in the last 72 hours. Sepsis Labs: No results for input(s): PROCALCITON, LATICACIDVEN in the last 168 hours.  Recent Results (from the past 240 hour(s))  SARS CORONAVIRUS 2 (TAT 6-24 HRS) Nasopharyngeal Nasopharyngeal Swab     Status: None   Collection Time: 08/12/19 11:02 AM   Specimen: Nasopharyngeal Swab  Result Value Ref Range Status   SARS Coronavirus 2 NEGATIVE NEGATIVE Final    Comment: (NOTE) SARS-CoV-2 target nucleic acids are NOT DETECTED. The SARS-CoV-2 RNA is generally detectable in upper and lower respiratory specimens during the acute phase of infection. Negative results do not preclude SARS-CoV-2 infection, do not rule out co-infections with other pathogens, and should not be used as the sole basis for treatment or other patient management decisions. Negative results must be combined with clinical observations, patient history,  and epidemiological information. The expected result is Negative. Fact Sheet for Patients: HairSlick.nohttps://www.fda.gov/media/138098/download Fact Sheet for Healthcare Providers: quierodirigir.comhttps://www.fda.gov/media/138095/download This test is not yet approved or cleared by the [place] FDA and  has been authorized for detection and/or diagnosis of SARS-CoV-2 by FDA under an Emergency Use Authorization (EUA). This EUA will remain  in effect (meaning this test can be used) for the duration of the COVID-19 declaration under Section 56 4(b)(1) of the Act, 21 U.S.C. section 360bbb-3(b)(1), unless the authorization is terminated or revoked  sooner. Performed at [facility], [address] [place], [place] [address]          Radiology Studies: CT Head Wo Contrast  Result Date: 08/12/2019 CLINICAL DATA:  Cerebral hemorrhage suspected. Additional provided: Numbness in hands, bilateral grips equal. Muffled speech. EXAM: CT HEAD WITHOUT CONTRAST TECHNIQUE: Contiguous axial images were obtained from the base of the skull through the vertex without intravenous contrast. COMPARISON:  No pertinent prior studies available for comparison. FINDINGS: Brain: No evidence of acute intracranial hemorrhage. No evidence of acute demarcated cortical infarction. Chronic right PCA vascular territory cortically based infarct involving the right parietooccipital and posteromedial right temporal lobes. No evidence of intracranial mass. No midline shift or extra-axial fluid collection. Mild ill-defined hypoattenuation within the cerebral white matter is nonspecific, but consistent with chronic small vessel ischemic disease. Mild generalized parenchymal atrophy. Mineralization within the bilateral basal ganglia. Vascular: No hyperdense vessel.  Atherosclerotic calcifications. Skull: Normal. Negative for fracture or focal lesion. Sinuses/Orbits: Visualized orbits demonstrate no acute abnormality. Mild ethmoid sinus mucosal thickening. No significant mastoid effusion. IMPRESSION: No CT evidence of acute intracranial abnormality. Chronic right PCA vascular territory cortically based infarct. Mild generalized parenchymal atrophy and chronic small vessel ischemic disease. Electronically Signed   By: Jackey LogeKyle  Golden DO   On: 08/12/2019 06:11   MR BRAIN WO CONTRAST  Result Date: 08/12/2019 CLINICAL DATA:  Focal neuro deficit, greater than 6 hours, stroke suspected. EXAM: MRI HEAD WITHOUT CONTRAST TECHNIQUE: Multiplanar, multiecho pulse sequences of the brain and surrounding structures were obtained without intravenous contrast. COMPARISON:  Head CT 08/12/2019  FINDINGS: Brain: Multiple sequences are motion degraded. Most notably, there is moderate motion degradation of the axial T2 FLAIR sequence 12 x 10 mm focus of restricted diffusion within the right ventral pons consistent with acute infarct (series 5, image 67). Corresponding T2/FLAIR hyperintensity at this site. Redemonstrated chronic right PCA territory cortically based infarct. No intracranial mass is identified. No midline shift or extra-axial fluid collection. There are several supratentorial chronic microhemorrhages. Small chronic lacunar infarcts within the basal ganglia, thalami and cerebellum. Background of otherwise mild chronic small vessel ischemic disease. Moderate generalized parenchymal atrophy. Asymmetric T2/FLAIR hyperintensity within the left petrous apex. Vascular: Flow voids maintained within the proximal large arterial vessels. Skull and upper cervical spine: No focal marrow lesion Sinuses/Orbits: Bilateral lens replacements. Minimal ethmoid sinus mucosal thickening. No significant mastoid effusion. IMPRESSION: Motion degraded examination. 12 x 10 mm acute infarct within the right ventral pons. Redemonstrated chronic right PCA territory cortically based infarct. Multiple small chronic lacunar infarcts within the basal ganglia, thalami and cerebellum. Background of otherwise mild chronic small vessel ischemic disease. Moderate generalized parenchymal atrophy. Asymmetric T2 hyperintensity within the left petrous apex. This finding is nonspecific, but may reflect trapped fluid. Electronically Signed   By: Jackey LogeKyle  Golden DO   On: 08/12/2019 09:43   ECHOCARDIOGRAM COMPLETE  Result Date: 08/12/2019   ECHOCARDIOGRAM REPORT   [facility] Date  of Exam: 08/12/2019 Medical Rec #:  [id number]    Height:       64.0 in Accession #:    [id number]   Weight:       150.0 lb Date of Birth:  Nov 16, 1934     BSA:          1.73 m Patient Age:    84 years     BP:           154/99 mmHg Patient Gender: F             HR:           125 bpm. Exam Location:  Inpatient Procedure: 2D Echo, Color Doppler and Cardiac Doppler Indications:    Stroke  History:        Patient has no prior history of Echocardiogram examinations.                 Risk Factors:Hypertension, Diabetes and Dyslipidemia.  Sonographer:    Irving Burton Senior RDCS Referring Phys: [id number] JENNIFER YATES IMPRESSIONS  1. Left ventricular ejection fraction, by visual estimation, is 60 to 65%. The left ventricle has normal function. Left ventricular septal wall thickness was normal. There is mildly increased left ventricular hypertrophy.  2. Left ventricular diastolic function could not be evaluated.  3. Global right ventricle was not well visualized.The right ventricular size is not well visualized. Right vetricular wall thickness was not assessed.  4. Left atrial size was mildly dilated.  5. Right atrial size was normal.  6. The mitral valve was not well visualized. No evidence of mitral valve regurgitation.  7. The tricuspid valve is not well visualized.  8. The aortic valve was not well visualized. Aortic valve regurgitation is not visualized. Mild aortic valve sclerosis without stenosis.  9. The pulmonic valve was not well visualized. Pulmonic valve regurgitation is not visualized. 10. Technically difficult echo. Poor image quality. 11. The atrial septum is grossly normal. FINDINGS  Left Ventricle: Left ventricular ejection fraction, by visual estimation, is 60 to 65%. The left ventricle has normal function. The left ventricle is not well visualized. There is mildly increased left ventricular hypertrophy. The left ventricular diastology could not be evaluated due to atrial fibrillation. Left ventricular diastolic function could not be evaluated. Right Ventricle: The right ventricular size is not well visualized. Right vetricular wall thickness was not assessed. Global RV systolic function is was not well visualized. Left Atrium: Left atrial size was mildly dilated.  Right Atrium: Right atrial size was normal in size Pericardium: There is no evidence of pericardial effusion. Mitral Valve: The mitral valve was not well visualized. No evidence of mitral valve regurgitation. Tricuspid Valve: The tricuspid valve is not well visualized. Tricuspid valve regurgitation is not demonstrated. Aortic Valve: The aortic valve was not well visualized. . There is mild thickening of the aortic valve. Aortic valve regurgitation is not visualized. Mild aortic valve sclerosis is present, with no evidence of aortic valve stenosis. There is mild thickening of the aortic valve. Pulmonic Valve: The pulmonic valve was not well visualized. Pulmonic valve regurgitation is not visualized. Pulmonic regurgitation is not visualized. Aorta: The aortic root and ascending aorta are structurally normal, with no evidence of dilitation. IAS/Shunts: The atrial septum is grossly normal. Additional Comments: Technically difficult echo. Poor image quality.  LEFT VENTRICLE PLAX 2D LVIDd:         2.80 cm LVIDs:         1.90 cm LV PW:  1.20 cm LV IVS:        1.20 cm LVOT diam:     2.10 cm LV SV:         18 ml LV SV Index:   10.41 LVOT Area:     3.46 cm  RIGHT VENTRICLE TAPSE (M-mode): 1.7 cm LEFT ATRIUM             Index       RIGHT ATRIUM           Index LA diam:        4.00 cm 2.31 cm/m  RA Area:     14.20 cm LA Vol (A2C):   58.6 ml 33.85 ml/m RA Volume:   35.70 ml  20.62 ml/m LA Vol (A4C):   75.2 ml 43.44 ml/m LA Biplane Vol: 66.7 ml 38.53 ml/m  AORTIC VALVE LVOT Vmax:   78.30 cm/s LVOT Vmean:  56.000 cm/s LVOT VTI:    0.114 m  AORTA Ao Root diam: 2.60 cm  SHUNTS Systemic VTI:  0.11 m Systemic Diam: 2.10 cm  Kristeen Miss MD Electronically signed by Kristeen Miss MD Signature Date/Time: 08/12/2019/3:36:36 PM    Final         Scheduled Meds: . brimonidine  1 drop Both Eyes TID  . clopidogrel  75 mg Oral Daily  . insulin aspart  0-15 Units Subcutaneous TID WC  . insulin aspart  0-5 Units  Subcutaneous QHS  . rosuvastatin  10 mg Oral Daily   Continuous Infusions: . sodium chloride 50 mL/hr at 08/12/19 2252  . diltiazem (CARDIZEM) infusion Stopped (08/12/19 2148)  . heparin 1,000 Units/hr (08/12/19 2331)     LOS: 1 day     Jacquelin Hawking, MD [facility] 08/13/2019, 7:56 AM  If 7PM-7AM, please contact night-coverage [email or web address]

## 2019-08-13 NOTE — Progress Notes (Signed)
STROKE TEAM PROGRESS NOTE   HISTORY OF PRESENT ILLNESS (per record) Rhonda Golden is an 85 y.o. female  With PMH siginficant for HTN, HLD, CVA presented to [facility] ED for c/o 1 day history of weakness. Per chart review: Patient stated that she noticed the weakness yesterday morning when she tried to get up. She has had a stroke in the past, but feels that the left side of her body is weaker than usual. ED course:  CTH: no hemorrhage; chronic right PCA cortically based infarct. MRI: 12x10 mm acuter infarct in the right ventral pons. tPA Given: No: outside of window Last known well: The night of 08/10/2019   INTERVAL HISTORY Her family is not at bedside. She is sitting up in bed, opens eyes but is very lethargic. Appears confused.     OBJECTIVE Vitals:   08/13/19 0845 08/13/19 0922 08/13/19 1044 08/13/19 1213  BP: (!) 196/73 (!) 182/57 (!) 170/74 (!) 168/65  Pulse: 76 70 61 63  Resp: 18 18 17 15   Temp: (!) 97.5 F (36.4 C) 97.8 F (36.6 C)  97.7 F (36.5 C)  TempSrc: Oral Oral  Oral  SpO2: 97% 98% 96% 96%  Weight:      Height:        CBC:  Recent Labs  Lab 08/11/19 1936 08/11/19 2055 08/13/19 0257  WBC 10.3  --  9.7  NEUTROABS 9.6*  --   --   HGB 13.6 15.0 12.3  HCT 44.8 44.0 39.1  MCV 76.7*  --  74.9*  PLT 311  --  676    Basic Metabolic Panel:  Recent Labs  Lab 08/11/19 1936 08/11/19 2055  NA 140 142  K 3.8 3.7  CL 106 108  CO2 21*  --   GLUCOSE 167* 157*  BUN 24* 25*  CREATININE 1.08* 1.00  CALCIUM 9.5  --     Lipid Panel:     Component Value Date/Time   CHOL 135 08/13/2019 0300   TRIG 71 08/13/2019 0300   HDL 61 08/13/2019 0300   CHOLHDL 2.2 08/13/2019 0300   VLDL 14 08/13/2019 0300   LDLCALC 60 08/13/2019 0300   HgbA1c:  Lab Results  Component Value Date   HGBA1C 6.2 (H) 08/13/2019   Urine Drug Screen: No results found for: LABOPIA, COCAINSCRNUR, LABBENZ, AMPHETMU, THCU, LABBARB  Alcohol Level No results found for: ETH  IMAGING  CT Head  Wo Contrast 08/12/2019 IMPRESSION:  No CT evidence of acute intracranial abnormality. Chronic right PCA vascular territory cortically based infarct. Mild generalized parenchymal atrophy and chronic small vessel ischemic disease.   MR BRAIN WO CONTRAST 08/12/2019 IMPRESSION:  Motion degraded examination. 12 x 10 mm acute infarct within the right ventral pons. Redemonstrated chronic right PCA territory cortically based infarct. Multiple small chronic lacunar infarcts within the basal ganglia, thalami and cerebellum. Background of otherwise mild chronic small vessel ischemic disease. Moderate generalized parenchymal atrophy. Asymmetric T2 hyperintensity within the left petrous apex. This finding is nonspecific, but may reflect trapped fluid.   ECHOCARDIOGRAM COMPLETE 08/12/2019 IMPRESSIONS   1. Left ventricular ejection fraction, by visual estimation, is 60 to 65%. The left ventricle has normal function. Left ventricular septal wall thickness was normal. There is mildly increased left ventricular hypertrophy.   2. Left ventricular diastolic function could not be evaluated.   3. Global right ventricle was not well visualized.The right ventricular size is not well visualized. Right vetricular wall thickness was not assessed.   4. Left atrial size was mildly dilated.  5. Right atrial size was normal.   6. The mitral valve was not well visualized. No evidence of mitral valve regurgitation.   7. The tricuspid valve is not well visualized.   8. The aortic valve was not well visualized. Aortic valve regurgitation is not visualized. Mild aortic valve sclerosis without stenosis.   9. The pulmonic valve was not well visualized. Pulmonic valve regurgitation is not visualized.  10. Technically difficult echo. Poor image quality.  11. The atrial septum is grossly normal.    Bilateral Carotid Dopplers  00/00/2020 Pending  ECG atrial fibrillation - ventricular response 127 BPM (See cardiology reading for  complete details)  PHYSICAL EXAM Blood pressure (!) 168/65, pulse 63, temperature 97.7 F (36.5 C), temperature source Oral, resp. rate 15, height 5\' 4"  (1.626 m), weight 68 kg, SpO2 96 %.  This is an elderly African-American female who is laying in bed, opens eyes to voice however she is lethargic and appears confused.  Speech with slight dysarthria and is not spontaneous.  No aphasia.  She has left ptosis, left facial droop, appears to have intact facial sensation but difficult exam due to lethargy, she will lift both arms antigravity but the left side does appear weaker in the upper and lower extremities, on the right appears to have antigravity strength upper and lower extremity, no ataxia or dysmetria noted, gait deferred.   ASSESSMENT/PLAN Ms. Rhonda Golden is a 85 y.o. female with history of HTN, DM, HLD, ASPVD (leg stents), CVA  presenting with increased left sided weakness. She did not receive IV t-PA due to late presentation (>4.5 hours from time of onset)  Stroke:  acute infarct within the right ventral pons - likely embolic with new onset atrial fibrillation..   Code Stroke CT Head - not ordered  CT head - No CT evidence of acute intracranial abnormality. Chronic right PCA vascular territory cortically based infarct. Mild generalized parenchymal atrophy and chronic small vessel ischemic disease.   MRI head - Motion degraded examination. 12 x 10 mm acute infarct within the right ventral pons. Redemonstrated chronic right PCA territory cortically based infarct. Multiple small chronic lacunar infarcts within the basal ganglia, thalami and cerebellum. Background of otherwise mild chronic small vessel ischemic disease. Moderate generalized parenchymal atrophy.  MRA head - not performed  CTA H&N - not performed  CT Perfusion - not performed  Carotid Doppler - pending  2D Echo - EF 60 - 65%. No cardiac source of emboli identified.   Sars Corona Virus 2 - negative  LDL -  60  HgbA1c - 6.5  UDS - not ordered  VTE prophylaxis - Heparin IV Diet  Diet Order            DIET DYS 3 Room service appropriate? Yes with Assist; Fluid consistency: Thin  Diet effective now              clopidogrel 75 mg daily prior to admission, now on clopidogrel 75 mg daily and heparin IV.  Agree with cardiology to stop heparin and start Eliquis 5 mg twice daily.  Patient counseled to be compliant with her antithrombotic medications  Ongoing aggressive stroke risk factor management  Therapy recommendations:  pending  Disposition:  Pending  Hypertension  Home BP meds: Norvasc, Amlodipine, HCTC, Apresoline  Current BP meds: none   Blood pressure somewhat high at times but within post stroke/TIA parameters . Permissive hypertension (OK if < 220/120) but gradually normalize in 5-7 days  . Long-term BP goal normotensive  Hyperlipidemia  Home Lipid lowering medication: Crestor 10 mg daily  LDL 60, goal < 70  Current lipid lowering medication: Crestor 10 mg daily  Continue statin at discharge  Diabetes  Home diabetic meds: Metformin  Current diabetic meds: insulin  HgbA1c 6.5, goal < 7.0 Recent Labs    08/12/19 2300 08/13/19 0614 08/13/19 1212  GLUCAP 122* 96 131*    Other Stroke Risk Factors  Advanced age  Hx stroke/TIA  Other Active Problems  New onset atrial fibrillation - heparin IV and Cardizem IV  Cardiology onboard. Agree with stopping IV Heparin and Plavix and starting eliquis.    Hospital day # 1  Stroke will sign off at this time. Personally examined patient and images, and have participated in and made any corrections needed to history, physical, neuro exam,assessment and plan as stated above.  I have personally obtained the history, evaluated lab date, reviewed imaging studies and agree with radiology interpretations.    Naomie Dean, MD Stroke Neurology   A total of 35 minutes was spent for the care of this patient, spent  on counseling patient and family on different diagnostic and therapeutic options, counseling and coordination of care, riskd ans benefits of management, compliance, or risk factor reduction and education.   To contact Stroke Continuity provider, please refer to [email or web address]. After hours, contact General Neurology

## 2019-08-13 NOTE — Progress Notes (Signed)
Initial Nutrition Assessment  DOCUMENTATION CODES:   Not applicable  INTERVENTION:  Ensure Enlive po BID, each supplement provides 350 kcal and 20 grams of protein    NUTRITION DIAGNOSIS:   Inadequate oral intake related to acute illness(Right pontine CVA) as evidenced by estimated needs(per chart, pt consuming 43% average of meals).    GOAL:   Patient will meet greater than or equal to 90% of their needs    MONITOR:   PO intake, Supplement acceptance, I & O's, Labs, Weight trends  REASON FOR ASSESSMENT:   Consult Other (Comment)(CVA)  ASSESSMENT:  RD working remotely.  85 year old female with past medical history of HTN, HLD, T2DM, PAD s/p leg stents, and prior CVA (2013) admitted for right pontine CVA.  Per notes pt somnolent on arrival secondary from Ativan. Daughter of pt reports unable to ambulate secondary to left leg weakness and slurred speech with left facial droop 1 day prior to admission. Patient with residual left sided weakness s/p 2013 CVA. Patient has been recommended for inpatient rehab, evaluation planned for Monday.   1/02- SLP noted pt with mild oral dysphagia secondary to dentition and suspected functional pharyngeal phase of swallow. Recommendations for dysphagia 3 with full supervision to assist with self feeding and cue for compensatory strategies. Per chart, patient consuming 10-75% of meals at this time. RD will provide Ensure to aid with calorie/protein needs.   I/Os: +177 ml since admit UOP 825 ml x 24 hrs  Current wt 68 kg (149.6 lbs) Weight history reviewed, noted 8.14 lb (5.2%) wt loss in the past 3 months. On 10/07 pt wt 71.7 kg (157.74 lbs)  Wt loss is insignificant for time frame, but still concerning given advanced age and history of present illness.  Medications reviewed and include: SS novolog  Labs: CBGs 96-131 x 24 hrs  NUTRITION - FOCUSED PHYSICAL EXAM: Unable to complete at this time, RD working remotely.   Diet Order:    Diet Order            DIET DYS 3 Room service appropriate? Yes with Assist; Fluid consistency: Thin  Diet effective now              EDUCATION NEEDS:   No education needs have been identified at this time  Skin:  Skin Assessment: Reviewed RN Assessment(PI; bilateral heels; medial toes)  Last BM:  12/31  Height:   Ht Readings from Last 1 Encounters:  08/12/19 5\' 4"  (1.626 m)    Weight:   Wt Readings from Last 1 Encounters:  08/12/19 68 kg    Ideal Body Weight:  54.5 kg  BMI:  Body mass index is 25.73 kg/m.  Estimated Nutritional Needs:   Kcal:  1350-1500  Protein:  68-75  Fluid:  >/= 1.2 L/day   10/10/19, RD, LDN Clinical Nutrition [employer] Telephone [phone] After Hours/Weekend Pager: [phone]

## 2019-08-13 NOTE — Evaluation (Signed)
Occupational Therapy Evaluation Patient Details Name: Rhonda Golden MRN: [id number] DOB: 11-04-1934 Today's Date: 08/13/2019    History of Present Illness Rhonda Golden is a 85 y.o. female with medical history significant of HTN; HLD; DM; PAD s/p leg stents; and prior h/o CVA (2013) presenting with left lower extremity weakness and slurred speech.  MRI on 08/12/19 revealed "12 x 10 mm acute infarct within the right ventral pons."    Clinical Impression   PTA, pt was living with her daughter-in-law and was performing ADLs and IADLs and using cane and RW for mobility. Pt currently requiring Mod-Max A with ADLs and Mod A +2 for functional mobility. Pt presenting with decreased functional use of LUE, balance, strength, safety, and cognition. Pt motivated to participate in therapy. Pt will require further acute OT to increase functional performance and faciltiate safe dc. Recommend dc to CIR for intensive OT to increase safety, independence, and return to PLOF as well as decrease caregiver burden.    Follow Up Recommendations  CIR;Supervision/Assistance - 24 hour    Equipment Recommendations  Other (comment)(Defer to next venue)    Recommendations for Other Services PT consult     Precautions / Restrictions Precautions Precautions: Fall;Other (comment) Precaution Comments: Left hemiplegia; LLE extensor tone Restrictions Weight Bearing Restrictions: No      Mobility Bed Mobility Overal bed mobility: Needs Assistance Bed Mobility: Sit to Supine;Supine to Sit     Supine to sit: Max assist Sit to supine: Max assist   General bed mobility comments: Cues for hooking RLE under LLE to progress off edge of bed and reaching for left railing; maxA for trunk elevation to upright and use of bed pad to scoot hips forward to edge of bed. MaxA for leg negotiation back into bed and to reposition.  Transfers Overall transfer level: Needs assistance Equipment used: Rolling walker (2 wheeled) Transfers:  Sit to/from Stand Sit to Stand: Mod assist;+2 physical assistance         General transfer comment: ModA + 2 to stand from edge of bed with good initiation; cues for hand placement (left hand placed on walker and right hand pushed from bed). Pre gait training including weight shifting and side steps. Pt with decreased ability to weight shift, erratic placement of left foot, increased time/effort for sequencing. Tactile cues provided for decreased left knee hyperextension.     Balance Overall balance assessment: Needs assistance Sitting-balance support: Feet unsupported Sitting balance-Leahy Scale: Good Sitting balance - Comments: able to sit unsupported   Standing balance support: Bilateral upper extremity supported Standing balance-Leahy Scale: Poor Standing balance comment: reliant on external support                           ADL either performed or assessed with clinical judgement   ADL Overall ADL's : Needs assistance/impaired Eating/Feeding: Moderate assistance;Sitting Eating/Feeding Details (indicate cue type and reason): Pt requiring increased assistance for incorporating LUE into task. Max hand over hand to use LUE to bring cup with straw to mouth. Pt able to bring cup to mouth with right hand.   Grooming: Moderate assistance;Sitting Grooming Details (indicate cue type and reason): Mod A for bilateral tasks Upper Body Bathing: Moderate assistance;Sitting;Bed level   Lower Body Bathing: Maximal assistance;Sit to/from stand;+2 for physical assistance   Upper Body Dressing : Moderate assistance;Sitting;Bed level   Lower Body Dressing: Maximal assistance;Sit to/from stand;+2 for physical assistance   Toilet Transfer: Moderate assistance;+2 for physical assistance(Simulated in room)  Functional mobility during ADLs: Moderate assistance;+2 for physical assistance;+2 for safety/equipment;Rolling walker       Vision Baseline Vision/History: Wears  glasses(Reports prior cataract sx) Wears Glasses: Reading only Patient Visual Report: No change from baseline Additional Comments: Need to further assess. Denies changes     Perception     Praxis      Pertinent Vitals/Pain Pain Assessment: No/denies pain Faces Pain Scale: No hurt     Hand Dominance Right   Extremity/Trunk Assessment Upper Extremity Assessment Upper Extremity Assessment: LUE deficits/detail LUE Deficits / Details: Decreased strength and coorindation. Pt reporting she broke her forearm several years ago and is unable to perform full extenion. Pt with decreased AROM at elbow and shoulder; forward flexion 0-30 degrees with compensatory hiking. Able to perform AROM of wrist and composite grasp. Unable to perform finger opposition LUE Coordination: decreased fine motor;decreased gross motor   Lower Extremity Assessment Lower Extremity Assessment: Defer to PT evaluation RLE Deficits / Details: WFL LLE Deficits / Details: Knee extension 3/5, ankle dorsiflexion 1/5, increased extensor tone LLE Coordination: decreased gross motor;decreased fine motor       Communication Communication Communication: Expressive difficulties(slurred speech)   Cognition Arousal/Alertness: Awake/alert Behavior During Therapy: WFL for tasks assessed/performed Overall Cognitive Status: Impaired/Different from baseline Area of Impairment: Memory;Problem solving;Attention                   Current Attention Level: Sustained Memory: Decreased short-term memory       Problem Solving: Difficulty sequencing;Requires verbal cues;Slow processing General Comments: Pt A&Ox4, mild slurred speech, requires cues for problem solving with mobility tasks. Requiring increased time with ADLs demosntrating slow processing   General Comments  VSS    Exercises     Shoulder Instructions      Home Living Family/patient expects to be discharged to:: Private residence Living Arrangements:  Children(daughter in law) Available Help at Discharge: Family;Available PRN/intermittently Type of Home: House Home Access: Stairs to enter [employer] of Steps: 3   Home Layout: One level     Bathroom Shower/Tub: Chief Strategy Officer: Standard     Home Equipment: Environmental consultant - 2 wheels;Gilmer Mor - single point      Lives With: Son(DIL)    Prior Functioning/Environment Level of Independence: Independent with assistive device(s)        Comments: Uses cane and RW for mobility. Performs BADLs and some IADLs including cooking and medication management        OT Problem List: Decreased strength;Decreased range of motion;Decreased activity tolerance;Impaired balance (sitting and/or standing);Decreased coordination;Decreased safety awareness;Decreased knowledge of use of DME or AE      OT Treatment/Interventions: Self-care/ADL training;Therapeutic exercise;Energy conservation;DME and/or AE instruction;Therapeutic activities;Patient/family education    OT Goals(Current goals can be found in the care plan section) Acute Rehab OT Goals Patient Stated Goal: get stronger OT Goal Formulation: With patient Time For Goal Achievement: 08/27/19 Potential to Achieve Goals: Good  OT Frequency: Min 3X/week   Barriers to D/C:            Co-evaluation PT/OT/SLP Co-Evaluation/Treatment: Yes Reason for Co-Treatment: Complexity of the patient's impairments (multi-system involvement);For patient/therapist safety;To address functional/ADL transfers PT goals addressed during session: Mobility/safety with mobility OT goals addressed during session: ADL's and self-care      AM-PAC OT "6 Clicks" Daily Activity     Outcome Measure Help from another person eating meals?: A Lot Help from another person taking care of personal grooming?: A Lot Help from another person toileting,  which includes using toliet, bedpan, or urinal?: A Lot Help from another person bathing (including  washing, rinsing, drying)?: A Lot Help from another person to put on and taking off regular upper body clothing?: A Lot Help from another person to put on and taking off regular lower body clothing?: A Lot 6 Click Score: 12   End of Session Equipment Utilized During Treatment: Gait belt;Rolling walker Nurse Communication: Mobility status  Activity Tolerance: Patient tolerated treatment well Patient left: in bed;with call bell/phone within reach;with bed alarm set;with nursing/sitter in room  OT Visit Diagnosis: Unsteadiness on feet (R26.81);Other abnormalities of gait and mobility (R26.89);Muscle weakness (generalized) (M62.81);Hemiplegia and hemiparesis Hemiplegia - Right/Left: Left Hemiplegia - dominant/non-dominant: Non-Dominant Hemiplegia - caused by: Cerebral infarction                Time: 2248-2500 OT Time Calculation (min): 29 min Charges:  OT General Charges $OT Visit: 1 Visit OT Evaluation $OT Eval Moderate Complexity: 1 Mod  Hosteen Kienast MSOT, OTR/L Acute Rehab Pager: [phone] Office: [place] 08/13/2019, 9:59 AM

## 2019-08-13 NOTE — Progress Notes (Signed)
   08/13/19 0254  Provider Notification  Provider Name/Title NP X. Blount  Date Provider Notified 08/13/19  Time Provider Notified [phone]  Notification Type Page  Notification Reason Other (Comment) (Pt' BP read 199/58)  Response See new orders  Date of Provider Response 08/13/19  Time of Provider Response 0300

## 2019-08-13 NOTE — Evaluation (Signed)
Clinical/Bedside Swallow Evaluation Patient Details  Name: Rhonda Golden MRN: [id number] Date of Birth: [birth year]  Today's Date: 08/13/2019 Time: SLP Start Time (ACUTE ONLY): 0800 SLP Stop Time (ACUTE ONLY): 9024 SLP Time Calculation (min) (ACUTE ONLY): 12 min  Past Medical History:  Past Medical History:  Diagnosis Date  . CVA (cerebral vascular accident) (HCC)    2013; 2021  . Diabetes mellitus (HCC)   . Hyperlipidemia   . Hypertension   . PAD (peripheral artery disease) (HCC)    leg stents   Past Surgical History: History reviewed. No pertinent surgical history. HPI:  Rhonda Golden is a 85 y.o. female with medical history significant of HTN; HLD; DM; PAD s/p leg stents; and prior h/o CVA (2013) presenting with neurologic symptoms.  MRI on 08/12/19 revealed "12 x 10 mm acute infarct within the right ventral pons." Pt reported that she participated in Speech Therapy for "lots of things" after her initial CVA in 2013.     Assessment / Plan / Recommendation Clinical Impression  Pt presents with mild oral dysphagia secondary to dentition and suspected functional pharyngeal phase of the swallow.  Oral mech exam was remarkable for a L facial droop, tongue deviation to the L, and top dentures with missing dentition on the bottom.  Pt was seen with trials of thin liquid, puree, and regular solids.  She benefited from assistance with self-feeding.  She exhibited good bolus acceptance and consistent hyolaryngeal elevation/excursion with all trials.  Mastication was mildly prolonged with regular solids and AP transport was prolonged with regular and pureed solids.  Trace oral residue was observed on the pt's posterior lingual surface following the regular solid trial; however, she was able to clear it with a cued liquid wash.  No clinical s/sx of aspiration were observed with any trials.  Recommend diet change to Dysphagia 3 (soft) solids and continuation of thin liquid with full supervision to assist with  self-feeding and to cue for compensatory strategies (listed below).  SLP will briefly f/u to monitor diet tolerance.    SLP Visit Diagnosis: Dysphagia, oral phase (R13.11)    Aspiration Risk  No limitations    Diet Recommendation Dysphagia 3 (Mech soft);Thin liquid   Liquid Administration via: Cup;Straw Medication Administration: Whole meds with liquid Supervision: Staff to assist with self feeding Compensations: Slow rate;Small sips/bites;Minimize environmental distractions Postural Changes: Seated upright at 90 degrees    Other  Recommendations Oral Care Recommendations: Oral care BID   Follow up Recommendations None      Frequency and Duration min 2x/week  2 weeks       Prognosis Prognosis for Safe Diet Advancement: Good Barriers to Reach Goals: Language deficits;Cognitive deficits      Swallow Study   General HPI: Rhonda Golden is a 85 y.o. female with medical history significant of HTN; HLD; DM; PAD s/p leg stents; and prior h/o CVA (2013) presenting with neurologic symptoms.  MRI on 08/12/19 revealed "12 x 10 mm acute infarct within the right ventral pons." Pt reported that she had Speech Therapy for "lots of things" after her inital CVA in 2013.   Type of Study: Bedside Swallow Evaluation Previous Swallow Assessment: None documented  Diet Prior to this Study: Regular;Thin liquids Temperature Spikes Noted: No Respiratory Status: Room air History of Recent Intubation: No Behavior/Cognition: Alert;Cooperative;Pleasant mood Oral Cavity Assessment: Within Functional Limits Oral Care Completed by SLP: No Oral Cavity - Dentition: Dentures, top;Missing dentition(No dentition on bottom ) Vision: Functional for self-feeding Self-Feeding Abilities: Needs assist Patient  Positioning: Upright in bed Baseline Vocal Quality: Normal Volitional Swallow: Able to elicit    Oral/Motor/Sensory Function Overall Oral Motor/Sensory Function: Mild impairment Facial ROM: Reduced  left;Suspected CN VII (facial) dysfunction Facial Symmetry: Abnormal symmetry left Facial Sensation: Within Functional Limits Lingual ROM: Reduced left Lingual Symmetry: Abnormal symmetry left   Ice Chips Ice chips: Not tested   Thin Liquid Thin Liquid: Within functional limits Presentation: Straw    Nectar Thick Nectar Thick Liquid: Not tested   Honey Thick Honey Thick Liquid: Not tested   Puree Puree: Within functional limits   Solid     Solid: Impaired Presentation: Self Fed Oral Phase Impairments: Impaired mastication Oral Phase Functional Implications: Prolonged oral transit;Impaired mastication;Oral residue     Colin Mulders M.S., CCC-SLP Acute Rehabilitation Services Office: [phone]  Elvia Collum [facility] 08/13/2019,9:08 AM

## 2019-08-13 NOTE — Evaluation (Signed)
Speech Language Pathology Evaluation Patient Details Name: Rhonda Golden MRN: [id number] DOB: [birth year] Today's Date: 08/13/2019 Time: 2025-4270 SLP Time Calculation (min) (ACUTE ONLY): 22 min  Problem List:  Patient Active Problem List   Diagnosis Date Noted  . Right pontine CVA (HCC) 08/12/2019  . Diabetes mellitus type 2 in nonobese (HCC) 05/18/2019  . PAD (peripheral artery disease) (HCC) 05/18/2019  . Hyperlipidemia 05/18/2019  . Essential hypertension 05/18/2019   Past Medical History:  Past Medical History:  Diagnosis Date  . CVA (cerebral vascular accident) (HCC)    2013; 2021  . Diabetes mellitus (HCC)   . Hyperlipidemia   . Hypertension   . PAD (peripheral artery disease) (HCC)    leg stents   Past Surgical History: History reviewed. No pertinent surgical history. HPI:  Rhonda Golden is a 85 y.o. female with medical history significant of HTN; HLD; DM; PAD s/p leg stents; and prior h/o CVA (2013) presenting with neurologic symptoms.  MRI on 08/12/19 revealed "12 x 10 mm acute infarct within the right ventral pons." Pt reported that she participated in Speech Therapy for "lots of things" after her initial CVA in 2013.     Assessment / Plan / Recommendation Clinical Impression  Pt was seen for a cognitive-linguistic evaluation in the setting of an acute R ventral pons infarct.  Pt reported a hx of cognitive deficits from a previous CVA in 2013 and stated that she recently moved in with her son and daughter-in-law.  She stated that her daughter assists her with financial management and that her son and DIL assist her with IADLs (medications, cooking, etc.).  Family was not present to determine cognitive-linguistic baseline.  Pt presents with a cognitive-linguistic impairment with deficits observed in the following areas: short-term memory, attention, problem solving, executive functioning, expressive language, and receptive language (see below for more information).  Pt additionally  presents with mild dysarthria with speech approximately 85% intelligible at the word and phrase level.  Recommend additional Speech Therapy at time of discharge and continued assistance with IADLs.  SLP will f/u for treatment targeting cognitive linguistic deficits per POC.     SLP Assessment  SLP Recommendation/Assessment: Patient needs continued Speech Lanaguage Pathology Services SLP Visit Diagnosis: Cognitive communication deficit (R41.841)    Follow Up Recommendations  Home health SLP    Frequency and Duration min 2x/week  2 weeks      SLP Evaluation Cognition  Overall Cognitive Status: History of cognitive impairments - at baseline Arousal/Alertness: Awake/alert Orientation Level: Oriented X4 Attention: Sustained Sustained Attention: Impaired Sustained Attention Impairment: Verbal basic Memory: Impaired Memory Impairment: Storage deficit;Decreased short term memory;Decreased recall of new information Decreased Short Term Memory: Verbal basic;Verbal complex Awareness: Appears intact Problem Solving: Impaired Problem Solving Impairment: Verbal complex Executive Function: Sequencing Sequencing: Impaired Sequencing Impairment: Verbal complex       Comprehension  Auditory Comprehension Overall Auditory Comprehension: Impaired Commands: Impaired One Step Basic Commands: 75-100% accurate Two Step Basic Commands: 75-100% accurate Multistep Basic Commands: 75-100% accurate Conversation: Complex    Expression Expression Primary Mode of Expression: Verbal Verbal Expression Overall Verbal Expression: Impaired Initiation: No impairment Level of Generative/Spontaneous Verbalization: Conversation Repetition: No impairment Naming: Impairment Responsive: 76-100% accurate Confrontation: Impaired Convergent: 75-100% accurate Verbal Errors: Language of confusion;Not aware of errors Pragmatics: No impairment Interfering Components: Attention Written Expression Dominant Hand:  Right   Oral / Motor  Oral Motor/Sensory Function Overall Oral Motor/Sensory Function: Mild impairment Facial ROM: Reduced left;Suspected CN VII (facial) dysfunction Facial Symmetry: Abnormal symmetry  left Facial Sensation: Within Functional Limits Lingual ROM: Reduced left Lingual Symmetry: Abnormal symmetry left Motor Speech Overall Motor Speech: Impaired Respiration: Within functional limits Phonation: Normal Resonance: Within functional limits Articulation: Impaired Level of Impairment: Word Intelligibility: Intelligibility reduced Word: 75-100% accurate Phrase: 75-100% accurate Sentence: 75-100% accurate Conversation: 75-100% accurate   GO                   Rhonda Golden M.S., CCC-SLP Acute Rehabilitation Services Office: [phone]  Rhonda Golden Rhonda Golden 08/13/2019, 9:15 AM

## 2019-08-13 NOTE — Progress Notes (Signed)
VASCULAR LAB PRELIMINARY  PRELIMINARY  PRELIMINARY  PRELIMINARY  Carotid duplex completed.    Preliminary report:  See CV proc for preliminary results.  Aleyssa Pike, RVT 08/13/2019, 8:17 PM

## 2019-08-13 NOTE — Consult Note (Signed)
Cardiology Consultation:   Patient ID: Rhonda Golden; [id number]; [birth year]   Admit date: 08/11/2019 Date of Consult: 08/13/2019  Primary Care Provider: Sharlene Dory, DO Primary Cardiologist: New to [facility] Primary Electrophysiologist:  None   Patient Profile:   Rhonda Golden is a 85 y.o. female with a PMH of HTN, HLD, DM type 2, PAD s/p unknown stenting in [place], CVA (2013), who presented with acute CVA,  who is being seen today for the evaluation of newly documented atrial fibrillation at the request of Dr. Caleb Popp.  History of Present Illness:   Rhonda Golden was in her usual state of health until Wednesday 08/10/2019 when she began having difficulty with her left leg. She was started on prednisone for possible hamstring injury, and supportive care with ice/heat. She subsequently started dragging her foot and reportedly had slurred speech, dysphagia, and increased somnolence 08/11/2019. Given persistent left-sided weakness, she presented to the hospital for further evaluation where she was found to have an acute infarct on MRI brain. Unfortunately she was outside of the window for tPA. She has had permissive HTN in the setting of acute CVA. Labs overall stable: Cr 1.08, Hgb 13.6, PLT 311. Initial EKG with sinus rhythm with rate 83bpm, non-specific T wave abnormalities, and no STE/D. She was subsequently found to be in atrial fibrillation with RVR on telemetry. Repeat EKG with atrial fibrillation with RVR with rate 127, with nearly diffuse ST-T wave abnormalities, though no overt STE; suspect rate changes. She was started on a diltiazem gtt with improvement in HR's. Echo showed EF 60-65%, mild LVH, mild LAE, and no significant valvular abnormalities. Cardiology was asked to evaluate for new onset atrial fibrillation.  Patient states that she occasionally feels brief palpitations but there is no prior documented history of atrial fibrillation.  She is in sinus rhythm this  morning.  Past Medical History:  Diagnosis Date  . CVA (cerebral vascular accident) (HCC)    2013; 2021  . Diabetes mellitus (HCC)   . Hyperlipidemia   . Hypertension   . PAD (peripheral artery disease) (HCC)    leg stents    History reviewed. No pertinent surgical history.   Home Medications:  Prior to Admission medications   Medication Sig Start Date End Date Taking? Authorizing Provider  ALPHAGAN P 0.1 % SOLN Place 1 drop into both eyes See admin instructions. unknown 07/11/19  Yes [provider]  amLODipine (NORVASC) 10 MG tablet Take 1 tablet (10 mg total) by mouth daily. 05/18/19  Yes Sharlene Dory, DO  cholecalciferol (VITAMIN D) 25 MCG (1000 UT) tablet Take 1,000 Units by mouth daily.   Yes [provider]  clopidogrel (PLAVIX) 75 MG tablet Take 1 tablet (75 mg total) by mouth daily. 05/18/19  Yes Sharlene Dory, DO  hydrALAZINE (APRESOLINE) 50 MG tablet Take 1 tablet (50 mg total) by mouth 3 (three) times daily. 05/18/19  Yes Sharlene Dory, DO  hydrochlorothiazide (HYDRODIURIL) 25 MG tablet Take 1 tablet (25 mg total) by mouth daily. 05/18/19  Yes Sharlene Dory, DO  metFORMIN (GLUCOPHAGE) 500 MG tablet Take 1 tablet (500 mg total) by mouth daily. 05/18/19  Yes Sharlene Dory, DO  Multiple Vitamins-Minerals (MULTIVITAMIN WITH MINERALS) tablet Take 1 tablet by mouth daily.   Yes [provider]  omega-3 acid ethyl esters (LOVAZA) 1 g capsule Take 1 g by mouth daily.   Yes [provider]  predniSONE (DELTASONE) 20 MG tablet Take 2 tablets (40 mg total) by  mouth daily with breakfast for 5 days. 08/10/19 08/15/19 Yes Wendling, Crosby Oyster, DO  Propylene Glycol-Glycerin (SOOTHE) 0.6-0.6 % SOLN Apply to eye.   Yes [provider]  rosuvastatin (CRESTOR) 10 MG tablet Take 1 tablet (10 mg total) by mouth daily. 05/18/19  Yes Shelda Pal, DO    Inpatient Medications: Scheduled Meds: .  brimonidine  1 drop Both Eyes TID  . clopidogrel  75 mg Oral Daily  . insulin aspart  0-15 Units Subcutaneous TID WC  . insulin aspart  0-5 Units Subcutaneous QHS  . rosuvastatin  10 mg Oral Daily   Continuous Infusions: . sodium chloride 50 mL/hr at 08/12/19 2252  . diltiazem (CARDIZEM) infusion Stopped (08/12/19 2148)  . heparin 1,000 Units/hr (08/12/19 2331)   PRN Meds: acetaminophen **OR** acetaminophen (TYLENOL) oral liquid 160 mg/5 mL **OR** acetaminophen, haloperidol lactate, senna-docusate  Allergies:   No Known Allergies  Social History:   Social History   Socioeconomic History  . Marital status: Single    Spouse name: Not on file  . Number of children: Not on file  . Years of education: Not on file  . Highest education level: Not on file  Occupational History  . Not on file  Tobacco Use  . Smoking status: Never Smoker  . Smokeless tobacco: Never Used  Substance and Sexual Activity  . Alcohol use: Not on file  . Drug use: Not on file  . Sexual activity: Not on file  Other Topics Concern  . Not on file  Social History Narrative  . Not on file   Social Determinants of Health   Financial Resource Strain:   . Difficulty of Paying Living Expenses: Not on file  Food Insecurity:   . Worried About Charity fundraiser in the Last Year: Not on file  . Ran Out of Food in the Last Year: Not on file  Transportation Needs:   . Lack of Transportation (Medical): Not on file  . Lack of Transportation (Non-Medical): Not on file  Physical Activity:   . Days of Exercise per Week: Not on file  . Minutes of Exercise per Session: Not on file  Stress:   . Feeling of Stress : Not on file  Social Connections:   . Frequency of Communication with Friends and Family: Not on file  . Frequency of Social Gatherings with Friends and Family: Not on file  . Attends Religious Services: Not on file  . Active Member of Clubs or Organizations: Not on file  . Attends Archivist  Meetings: Not on file  . Marital Status: Not on file  Intimate Partner Violence:   . Fear of Current or Ex-Partner: Not on file  . Emotionally Abused: Not on file  . Physically Abused: Not on file  . Sexually Abused: Not on file    Family History:    Family History  Problem Relation Age of Onset  . Hypertension Mother   . Hypertension Father      ROS:  Please see the history of present illness. All other ROS reviewed and negative.     Physical Exam/Data:   Vitals:   08/13/19 0400 08/13/19 0444 08/13/19 0845 08/13/19 0922  BP: (!) 173/57 (!) 168/61 (!) 196/73 (!) 182/57  Pulse: 64 (!) 59 76 70  Resp: 16 16 18 18   Temp: 98.5 F (36.9 C)  (!) 97.5 F (36.4 C) 97.8 F (36.6 C)  TempSrc: Oral  Oral Oral  SpO2: 98% 97% 97% 98%  Weight:      Height:        Intake/Output Summary (Last 24 hours) at 08/13/2019 0934 Last data filed at 08/13/2019 9518 Gross per 24 hour  Intake 1002.06 ml  Output 825 ml  Net 177.06 ml   Filed Weights   08/12/19 2300  Weight: 68 kg   Body mass index is 25.73 kg/m.  General: Patient in no acute distress. HEENT: Conjunctiva and lids normal, oropharynx clear. Neck: Supple, no elevated JVP or carotid bruits, no thyromegaly. Lungs: Clear to auscultation, nonlabored breathing at rest. Cardiac: Regular rate and rhythm, no S3 or significant systolic murmur. Abdomen: Soft, nontender, bowel sounds present. Extremities: No pitting edema, distal pulses 1+. Skin: Warm and dry. Musculoskeletal: No kyphosis. Neuropsychiatric: Alert and oriented x3, left-sided hemiparesis.  EKG:  The EKG was personally reviewed and demonstrates:  Sinus rhythm with rate 83bpm, non-specific T wave abnormalities, and no STE/D.  Repeat EKG with atrial fibrillation with RVR with rate 127, with nearly diffuse ST-T wave abnormalities, though no overt STE; suspect rate changes Telemetry:  Telemetry was personally reviewed and demonstrates: Normal sinus rhythm.  Relevant CV  Studies: Echocardiogram 08/12/2019: 1. Left ventricular ejection fraction, by visual estimation, is 60 to 65%. The left ventricle has normal function. Left ventricular septal wall thickness was normal. There is mildly increased left ventricular hypertrophy.  2. Left ventricular diastolic function could not be evaluated.  3. Global right ventricle was not well visualized.The right ventricular size is not well visualized. Right vetricular wall thickness was not assessed.  4. Left atrial size was mildly dilated.  5. Right atrial size was normal.  6. The mitral valve was not well visualized. No evidence of mitral valve regurgitation.  7. The tricuspid valve is not well visualized.  8. The aortic valve was not well visualized. Aortic valve regurgitation is not visualized. Mild aortic valve sclerosis without stenosis.  9. The pulmonic valve was not well visualized. Pulmonic valve regurgitation is not visualized. 10. Technically difficult echo. Poor image quality. 11. The atrial septum is grossly normal.  Laboratory Data:  Chemistry Recent Labs  Lab 08/11/19 1936 08/11/19 2055  NA 140 142  K 3.8 3.7  CL 106 108  CO2 21*  --   GLUCOSE 167* 157*  BUN 24* 25*  CREATININE 1.08* 1.00  CALCIUM 9.5  --   GFRNONAA 48*  --   GFRAA 55*  --   ANIONGAP 13  --     Recent Labs  Lab 08/11/19 1936  PROT 7.9  ALBUMIN 3.9  AST 19  ALT 14  ALKPHOS 97  BILITOT 0.3   Hematology Recent Labs  Lab 08/11/19 1936 08/11/19 2055 08/13/19 0257  WBC 10.3  --  9.7  RBC 5.84*  --  5.22*  HGB 13.6 15.0 12.3  HCT 44.8 44.0 39.1  MCV 76.7*  --  74.9*  MCH 23.3*  --  23.6*  MCHC 30.4  --  31.5  RDW 17.2*  --  16.8*  PLT 311  --  275    Radiology/Studies:  CT Head Wo Contrast  Result Date: 08/12/2019 CLINICAL DATA:  Cerebral hemorrhage suspected. Additional provided: Numbness in hands, bilateral grips equal. Muffled speech. EXAM: CT HEAD WITHOUT CONTRAST TECHNIQUE: Contiguous axial images were  obtained from the base of the skull through the vertex without intravenous contrast. COMPARISON:  No pertinent prior studies available for comparison. FINDINGS: Brain: No evidence of acute intracranial hemorrhage. No evidence of acute demarcated cortical infarction. Chronic right PCA vascular  territory cortically based infarct involving the right parietooccipital and posteromedial right temporal lobes. No evidence of intracranial mass. No midline shift or extra-axial fluid collection. Mild ill-defined hypoattenuation within the cerebral white matter is nonspecific, but consistent with chronic small vessel ischemic disease. Mild generalized parenchymal atrophy. Mineralization within the bilateral basal ganglia. Vascular: No hyperdense vessel.  Atherosclerotic calcifications. Skull: Normal. Negative for fracture or focal lesion. Sinuses/Orbits: Visualized orbits demonstrate no acute abnormality. Mild ethmoid sinus mucosal thickening. No significant mastoid effusion. IMPRESSION: No CT evidence of acute intracranial abnormality. Chronic right PCA vascular territory cortically based infarct. Mild generalized parenchymal atrophy and chronic small vessel ischemic disease. Electronically Signed   By: Jackey LogeKyle  Golden DO   On: 08/12/2019 06:11   MR BRAIN WO CONTRAST  Result Date: 08/12/2019 CLINICAL DATA:  Focal neuro deficit, greater than 6 hours, stroke suspected. EXAM: MRI HEAD WITHOUT CONTRAST TECHNIQUE: Multiplanar, multiecho pulse sequences of the brain and surrounding structures were obtained without intravenous contrast. COMPARISON:  Head CT 08/12/2019 FINDINGS: Brain: Multiple sequences are motion degraded. Most notably, there is moderate motion degradation of the axial T2 FLAIR sequence 12 x 10 mm focus of restricted diffusion within the right ventral pons consistent with acute infarct (series 5, image 67). Corresponding T2/FLAIR hyperintensity at this site. Redemonstrated chronic right PCA territory cortically  based infarct. No intracranial mass is identified. No midline shift or extra-axial fluid collection. There are several supratentorial chronic microhemorrhages. Small chronic lacunar infarcts within the basal ganglia, thalami and cerebellum. Background of otherwise mild chronic small vessel ischemic disease. Moderate generalized parenchymal atrophy. Asymmetric T2/FLAIR hyperintensity within the left petrous apex. Vascular: Flow voids maintained within the proximal large arterial vessels. Skull and upper cervical spine: No focal marrow lesion Sinuses/Orbits: Bilateral lens replacements. Minimal ethmoid sinus mucosal thickening. No significant mastoid effusion. IMPRESSION: Motion degraded examination. 12 x 10 mm acute infarct within the right ventral pons. Redemonstrated chronic right PCA territory cortically based infarct. Multiple small chronic lacunar infarcts within the basal ganglia, thalami and cerebellum. Background of otherwise mild chronic small vessel ischemic disease. Moderate generalized parenchymal atrophy. Asymmetric T2 hyperintensity within the left petrous apex. This finding is nonspecific, but may reflect trapped fluid. Electronically Signed   By: Jackey LogeKyle  Golden DO   On: 08/12/2019 09:43   ECHOCARDIOGRAM COMPLETE  Result Date: 08/12/2019   ECHOCARDIOGRAM REPORT   [facility] Date of Exam: 08/12/2019 Medical Rec #:  [id number]    Height:       64.0 in Accession #:    [id number]   Weight:       150.0 lb Date of Birth:  02/11/1935     BSA:          1.73 m Patient Age:    84 years     BP:           154/99 mmHg Patient Gender: F            HR:           125 bpm. Exam Location:  Inpatient Procedure: 2D Echo, Color Doppler and Cardiac Doppler Indications:    Stroke  History:        Patient has no prior history of Echocardiogram examinations.                 Risk Factors:Hypertension, Diabetes and Dyslipidemia.  Sonographer:    Irving BurtonEmily Senior RDCS Referring Phys: [id number] JENNIFER YATES IMPRESSIONS  1.  Left ventricular ejection fraction, by visual estimation, is 60  to 65%. The left ventricle has normal function. Left ventricular septal wall thickness was normal. There is mildly increased left ventricular hypertrophy.  2. Left ventricular diastolic function could not be evaluated.  3. Global right ventricle was not well visualized.The right ventricular size is not well visualized. Right vetricular wall thickness was not assessed.  4. Left atrial size was mildly dilated.  5. Right atrial size was normal.  6. The mitral valve was not well visualized. No evidence of mitral valve regurgitation.  7. The tricuspid valve is not well visualized.  8. The aortic valve was not well visualized. Aortic valve regurgitation is not visualized. Mild aortic valve sclerosis without stenosis.  9. The pulmonic valve was not well visualized. Pulmonic valve regurgitation is not visualized. 10. Technically difficult echo. Poor image quality. 11. The atrial septum is grossly normal. FINDINGS  Left Ventricle: Left ventricular ejection fraction, by visual estimation, is 60 to 65%. The left ventricle has normal function. The left ventricle is not well visualized. There is mildly increased left ventricular hypertrophy. The left ventricular diastology could not be evaluated due to atrial fibrillation. Left ventricular diastolic function could not be evaluated. Right Ventricle: The right ventricular size is not well visualized. Right vetricular wall thickness was not assessed. Global RV systolic function is was not well visualized. Left Atrium: Left atrial size was mildly dilated. Right Atrium: Right atrial size was normal in size Pericardium: There is no evidence of pericardial effusion. Mitral Valve: The mitral valve was not well visualized. No evidence of mitral valve regurgitation. Tricuspid Valve: The tricuspid valve is not well visualized. Tricuspid valve regurgitation is not demonstrated. Aortic Valve: The aortic valve was not well  visualized. . There is mild thickening of the aortic valve. Aortic valve regurgitation is not visualized. Mild aortic valve sclerosis is present, with no evidence of aortic valve stenosis. There is mild thickening of the aortic valve. Pulmonic Valve: The pulmonic valve was not well visualized. Pulmonic valve regurgitation is not visualized. Pulmonic regurgitation is not visualized. Aorta: The aortic root and ascending aorta are structurally normal, with no evidence of dilitation. IAS/Shunts: The atrial septum is grossly normal. Additional Comments: Technically difficult echo. Poor image quality.  LEFT VENTRICLE PLAX 2D LVIDd:         2.80 cm LVIDs:         1.90 cm LV PW:         1.20 cm LV IVS:        1.20 cm LVOT diam:     2.10 cm LV SV:         18 ml LV SV Index:   10.41 LVOT Area:     3.46 cm  RIGHT VENTRICLE TAPSE (M-mode): 1.7 cm LEFT ATRIUM             Index       RIGHT ATRIUM           Index LA diam:        4.00 cm 2.31 cm/m  RA Area:     14.20 cm LA Vol (A2C):   58.6 ml 33.85 ml/m RA Volume:   35.70 ml  20.62 ml/m LA Vol (A4C):   75.2 ml 43.44 ml/m LA Biplane Vol: 66.7 ml 38.53 ml/m  AORTIC VALVE LVOT Vmax:   78.30 cm/s LVOT Vmean:  56.000 cm/s LVOT VTI:    0.114 m  AORTA Ao Root diam: 2.60 cm  SHUNTS Systemic VTI:  0.11 m Systemic Diam: 2.10 cm  Kristeen MissPhilip Nahser MD  Electronically signed by Kristeen Miss MD Signature Date/Time: 08/12/2019/3:36:36 PM    Final     Assessment and Plan:   1.  Newly documented atrial fibrillation, paroxysmal.  She does report an occasional sense of brief palpitations but has had no prior documented arrhythmia.  CHA2DS2-VASc score is 7.  She is currently on IV heparin and IV diltiazem, has spontaneously converted back to sinus rhythm.  LVEF is normal at 60 to 65%, left atrium mildly dilated.  No major valvular abnormalities.  2.  Acute infarct within the right ventricular pons, chronic right PCA territory cortically based infarct and evidence of small chronic lacunar  infarcts within the basal ganglia, thalami, and cerebellum.  Patient was out of the window for tPA.  She has left hemiparesis and is being managed by the neurology team.  Currently on Plavix and Crestor.  PT and speech evaluations progressing.  3.  Essential hypertension at baseline. On Norvasc, hydralazine, and HCTZ as an outpatient.  Current systolics running in the 170s to 190s.  4.  Type 2 diabetes mellitus, hemoglobin A1c 6.2%.  Currently on insulin.  Outpatient regimen included Glucophage.  If able to take POs with no swallowing concerns for pills (and no other invasive tests are planned), would stop heparin and replaced with Eliquis 5 mg twice daily.  Could also change IV Cardizem to Cardizem CD 120 mg daily.  We will follow with you.  Jonelle Sidle, M.D., F.A.C.C.

## 2019-08-14 LAB — BASIC METABOLIC PANEL
Anion gap: 10 (ref 5–15)
BUN: 11 mg/dL (ref 8–23)
CO2: 22 mmol/L (ref 22–32)
Calcium: 8.7 mg/dL — ABNORMAL LOW (ref 8.9–10.3)
Chloride: 111 mmol/L (ref 98–111)
Creatinine, Ser: 0.76 mg/dL (ref 0.44–1.00)
GFR calc Af Amer: >60 mL/min (ref >60)
GFR calc non Af Amer: >60 mL/min (ref >60)
Glucose, Bld: 104 mg/dL — ABNORMAL HIGH (ref 70–99)
Potassium: 3.4 mmol/L — ABNORMAL LOW (ref 3.5–5.1)
Sodium: 143 mmol/L (ref 135–145)

## 2019-08-14 LAB — CBC
HCT: 38.6 % (ref 36.0–46.0)
Hemoglobin: 11.9 g/dL — ABNORMAL LOW (ref 12.0–15.0)
MCH: 23.7 pg — ABNORMAL LOW (ref 26.0–34.0)
MCHC: 30.8 g/dL (ref 30.0–36.0)
MCV: 76.9 fL — ABNORMAL LOW (ref 80.0–100.0)
Platelets: 263 10*3/uL (ref 150–400)
RBC: 5.02 MIL/uL (ref 3.87–5.11)
RDW: 16.9 % — ABNORMAL HIGH (ref 11.5–15.5)
WBC: 8.8 10*3/uL (ref 4.0–10.5)
nRBC: 0 % (ref 0.0–0.2)

## 2019-08-14 LAB — GLUCOSE, CAPILLARY
Glucose-Capillary: 96 mg/dL (ref 70–99)
Glucose-Capillary: 106 mg/dL — ABNORMAL HIGH (ref 70–99)
Glucose-Capillary: 150 mg/dL — ABNORMAL HIGH (ref 70–99)
Glucose-Capillary: 115 mg/dL — ABNORMAL HIGH (ref 70–99)
Glucose-Capillary: 123 mg/dL — ABNORMAL HIGH (ref 70–99)

## 2019-08-14 LAB — HEPARIN LEVEL (UNFRACTIONATED): Heparin Unfractionated: 0.85 IU/mL — ABNORMAL HIGH (ref 0.30–0.70)

## 2019-08-14 MED ORDER — STROKE: EARLY STAGES OF RECOVERY BOOK
Freq: Once | Status: AC
  Filled 2019-08-14: qty 1

## 2019-08-14 MED ORDER — HYDROCHLOROTHIAZIDE 25 MG PO TABS
25.0000 mg | ORAL_TABLET | Freq: Every day | ORAL | Status: DC
  Administered 2019-08-14 – 2019-08-18 (×5): 25 mg via ORAL
  Filled 2019-08-14 (×5): qty 1

## 2019-08-14 MED ORDER — APIXABAN 5 MG PO TABS
5.0000 mg | ORAL_TABLET | Freq: Two times a day (BID) | ORAL | Status: DC
  Administered 2019-08-14 – 2019-08-18 (×9): 5 mg via ORAL
  Filled 2019-08-14 (×9): qty 1

## 2019-08-14 MED ORDER — POTASSIUM CHLORIDE CRYS ER 20 MEQ PO TBCR
40.0000 meq | EXTENDED_RELEASE_TABLET | Freq: Once | ORAL | Status: AC
  Administered 2019-08-14: 09:00:00 40 meq via ORAL
  Filled 2019-08-14: qty 4

## 2019-08-14 MED ORDER — DILTIAZEM HCL ER COATED BEADS 120 MG PO CP24
120.0000 mg | ORAL_CAPSULE | Freq: Every day | ORAL | Status: DC
  Administered 2019-08-14 – 2019-08-18 (×5): 120 mg via ORAL
  Filled 2019-08-14 (×5): qty 1

## 2019-08-14 MED ORDER — AMLODIPINE BESYLATE 10 MG PO TABS: 10.0000 mg | ORAL_TABLET | Freq: Every day | ORAL | Status: DC

## 2019-08-14 MED ORDER — HYDRALAZINE HCL 50 MG PO TABS
50.0000 mg | ORAL_TABLET | Freq: Three times a day (TID) | ORAL | Status: DC
  Administered 2019-08-14 – 2019-08-18 (×13): 50 mg via ORAL
  Filled 2019-08-14 (×13): qty 1

## 2019-08-14 NOTE — Progress Notes (Signed)
   Progress Note  Patient Name: Rhonda Golden Date of Encounter: 08/14/2019  Please see consultation note from yesterday.  Interval hospital course reviewed.  Patient is taking p.o. medications at this point.  She has been switched to Cardizem CD 120 mg daily and Eliquis 5 mg twice daily.  Blood pressure also trending higher and started back on hydralazine and HCTZ per primary team.  She remains in sinus rhythm, telemetry reviewed.  No new recommendations at this time.  Signed, Nona Dell, MD  08/14/2019, 10:29 AM

## 2019-08-14 NOTE — Plan of Care (Signed)
Patient stable, discussed POC with patient, agreeable with plan, denies question/concerns at this time.   Problem: Self-Care: Goal: Ability to participate in self-care as condition permits will improve Outcome: Progressing   Problem: Ischemic Stroke/TIA Tissue Perfusion: Goal: Complications of ischemic stroke/TIA will be minimized Outcome: Progressing

## 2019-08-14 NOTE — Progress Notes (Signed)
PROGRESS NOTE    Rhonda Golden  ONG:[id number] DOB: 07/22/1935 DOA: 08/11/2019 PCP: Sharlene DoryWendling, Nicholas Paul, DO     Brief Narrative:  Rhonda Golden is a 85 y.o. femalewith medical history significant ofHTN; HLD;DM; PAD s/p leg stents. Patient presented secondary to concern for stroke with symptoms of difficulty walking, dragging her leg, slurred speech.  Work-up has revealed acute stroke. While admitted, she had an episode of atrial fibrillation with RVR managed on diltiazem drip and started on heparin drip. Neurology as well as cardiology were consulted.    New events last 24 hours / Subjective: Asking for her eyedrops.  Continues to have some left upper extremity weakness.  Assessment & Plan:   Principal Problem:   Right pontine CVA (HCC) Active Problems:   Diabetes mellitus type 2 in nonobese [facility](HCC)   PAD (peripheral artery disease) (HCC)   Hyperlipidemia   Essential hypertension   Acute stroke -MRI significant for 1.2 x 1 cm right ventral pontine stroke. LDL of 60. Previous hemoglobin A1C 6.2. Transthoracic Echocardiogram significant for normal EF and no embolic source (poor image quality). Atrial fibrillation captured on telemetry. Started on heparin drip and now on Eliquis -Appreciate neurology  Paroxysmal atrial fibrillation -Newly diagnosed. Found to have a rhythm consisted with atrial fibrillation and rates as high as 120-130s. Started on Diltiazem drip without bolus. Rhythm currently sinus with controlled rates. Transthoracic Echocardiogram obtained and significant for normal EF and mild left atrium dilation. -Was on Cardizem drip, converted to normal sinus.  Now on oral Cardizem -Appreciate cardiology  Essential hypertension -Patient is on amlodipine and hydrochlorothiazide as an outpatient. In setting of stroke, neurology recommending permissive hypertension -Continue diltiazem as mentioned above, resume home HCTZ  Hyperlipidemia -Continue Crestor  PAD -Per  chart review, patient is s/p stents in right leg in [place]; unsure of exact procedure. Patient is on Plavix as an outpatient, now on Eliquis  Hypokalemia -Replace, trend   In agreement with assessment of the pressure ulcer as below:  Pressure Injury 08/12/19 Foot Right;Left Pink blanchable heels, lateral, medial and toes (Active)  08/12/19 1808  Location: Foot  Location Orientation: Right;Left  Staging:   Wound Description (Comments): Pink blanchable heels, lateral, medial and toes  Present on Admission:     DVT prophylaxis: Eliquis Code Status: Full code Family Communication: No family at bedside Disposition Plan: CIR evaluation pending   Consultants:   Neurology  Cardiology   Antimicrobials:  Anti-infectives (From admission, onward)   None        Objective: Vitals:   08/14/19 0245 08/14/19 0452 08/14/19 0858 08/14/19 1153  BP: (!) 207/89 (!) 189/72 (!) 212/96 (!) 190/69  Pulse: 68 (!) 58 63 67  Resp: (!) 22 19 20 20   Temp:   97.8 F (36.6 C) 98.3 F (36.8 C)  TempSrc:   Oral Oral  SpO2: 97% 96% 97% 99%  Weight:      Height:        Intake/Output Summary (Last 24 hours) at 08/14/2019 1309 Last data filed at 08/14/2019 0600 Gross per 24 hour  Intake 380.03 ml  Output --  Net 380.03 ml   Filed Weights   08/12/19 2300  Weight: 68 kg    Examination:  General exam: Appears calm and comfortable  Respiratory system: Clear to auscultation. Respiratory effort normal. No respiratory distress. No conversational dyspnea.  Cardiovascular system: S1 & S2 heard, RRR. No murmurs. No pedal edema. Gastrointestinal system: Abdomen is nondistended, soft and nontender. Normal bowel sounds heard. Central  nervous system: Alert, residual left upper extremity weakness Extremities: Symmetric in appearance  Skin: No rashes, lesions or ulcers on exposed skin  Psychiatry: Judgement and insight appear normal. Mood & affect appropriate.   Data Reviewed: I have personally  reviewed following labs and imaging studies  CBC: Recent Labs  Lab 08/11/19 1936 08/11/19 2055 08/13/19 0257 08/14/19 0641  WBC 10.3  --  9.7 8.8  NEUTROABS 9.6*  --   --   --   HGB 13.6 15.0 12.3 11.9*  HCT 44.8 44.0 39.1 38.6  MCV 76.7*  --  74.9* 76.9*  PLT 311  --  275 263   Basic Metabolic Panel: Recent Labs  Lab 08/11/19 1936 08/11/19 2055 08/14/19 0641  NA 140 142 143  K 3.8 3.7 3.4*  CL 106 108 111  CO2 21*  --  22  GLUCOSE 167* 157* 104*  BUN 24* 25* 11  CREATININE 1.08* 1.00 0.76  CALCIUM 9.5  --  8.7*   GFR: Estimated Creatinine Clearance: 51.4 mL/min (by C-G formula based on SCr of 0.76 mg/dL). Liver Function Tests: Recent Labs  Lab 08/11/19 1936  AST 19  ALT 14  ALKPHOS 97  BILITOT 0.3  PROT 7.9  ALBUMIN 3.9   No results for input(s): LIPASE, AMYLASE in the last 168 hours. No results for input(s): AMMONIA in the last 168 hours. Coagulation Profile: Recent Labs  Lab 08/11/19 1936  INR 1.0   Cardiac Enzymes: No results for input(s): CKTOTAL, CKMB, CKMBINDEX, TROPONINI in the last 168 hours. BNP (last 3 results) No results for input(s): PROBNP in the last 8760 hours. HbA1C: Recent Labs    08/13/19 0257  HGBA1C 6.2*   CBG: Recent Labs  Lab 08/13/19 1631 08/13/19 2104 08/14/19 0453 08/14/19 0624 08/14/19 1202  GLUCAP 185* 74 96 106* 150*   Lipid Profile: Recent Labs    08/13/19 0300  CHOL 135  HDL 61  LDLCALC 60  TRIG 71  CHOLHDL 2.2   Thyroid Function Tests: No results for input(s): TSH, T4TOTAL, FREET4, T3FREE, THYROIDAB in the last 72 hours. Anemia Panel: No results for input(s): VITAMINB12, FOLATE, FERRITIN, TIBC, IRON, RETICCTPCT in the last 72 hours. Sepsis Labs: No results for input(s): PROCALCITON, LATICACIDVEN in the last 168 hours.  Recent Results (from the past 240 hour(s))  SARS CORONAVIRUS 2 (TAT 6-24 HRS) Nasopharyngeal Nasopharyngeal Swab     Status: None   Collection Time: 08/12/19 11:02 AM   Specimen:  Nasopharyngeal Swab  Result Value Ref Range Status   SARS Coronavirus 2 NEGATIVE NEGATIVE Final    Comment: (NOTE) SARS-CoV-2 target nucleic acids are NOT DETECTED. The SARS-CoV-2 RNA is generally detectable in upper and lower respiratory specimens during the acute phase of infection. Negative results do not preclude SARS-CoV-2 infection, do not rule out co-infections with other pathogens, and should not be used as the sole basis for treatment or other patient management decisions. Negative results must be combined with clinical observations, patient history, and epidemiological information. The expected result is Negative. Fact Sheet for Patients: HairSlick.no Fact Sheet for Healthcare Providers: quierodirigir.com This test is not yet approved or cleared by the [place] FDA and  has been authorized for detection and/or diagnosis of SARS-CoV-2 by FDA under an Emergency Use Authorization (EUA). This EUA will remain  in effect (meaning this test can be used) for the duration of the COVID-19 declaration under Section 56 4(b)(1) of the Act, 21 U.S.C. section 360bbb-3(b)(1), unless the authorization is terminated or revoked sooner. Performed  at [facility], [address], [place], [place] [address]       Radiology Studies: ECHOCARDIOGRAM COMPLETE  Result Date: 08/12/2019   ECHOCARDIOGRAM REPORT   Patient Name:   DORELLA LASTER Date of Exam: 08/12/2019 Medical Rec #:  [id number]    Height:       64.0 in Accession #:    [id number]   Weight:       150.0 lb Date of Birth:  May 21, 1935     BSA:          1.73 m Patient Age:    84 years     BP:           154/99 mmHg Patient Gender: F            HR:           125 bpm. Exam Location:  Inpatient Procedure: 2D Echo, Color Doppler and Cardiac Doppler Indications:    Stroke  History:        Patient has no prior history of Echocardiogram examinations.                 Risk Factors:Hypertension,  Diabetes and Dyslipidemia.  Sonographer:    Irving Burton Senior RDCS Referring Phys: [id number] Elinora Weigand YATES IMPRESSIONS  1. Left ventricular ejection fraction, by visual estimation, is 60 to 65%. The left ventricle has normal function. Left ventricular septal wall thickness was normal. There is mildly increased left ventricular hypertrophy.  2. Left ventricular diastolic function could not be evaluated.  3. Global right ventricle was not well visualized.The right ventricular size is not well visualized. Right vetricular wall thickness was not assessed.  4. Left atrial size was mildly dilated.  5. Right atrial size was normal.  6. The mitral valve was not well visualized. No evidence of mitral valve regurgitation.  7. The tricuspid valve is not well visualized.  8. The aortic valve was not well visualized. Aortic valve regurgitation is not visualized. Mild aortic valve sclerosis without stenosis.  9. The pulmonic valve was not well visualized. Pulmonic valve regurgitation is not visualized. 10. Technically difficult echo. Poor image quality. 11. The atrial septum is grossly normal. FINDINGS  Left Ventricle: Left ventricular ejection fraction, by visual estimation, is 60 to 65%. The left ventricle has normal function. The left ventricle is not well visualized. There is mildly increased left ventricular hypertrophy. The left ventricular diastology could not be evaluated due to atrial fibrillation. Left ventricular diastolic function could not be evaluated. Right Ventricle: The right ventricular size is not well visualized. Right vetricular wall thickness was not assessed. Global RV systolic function is was not well visualized. Left Atrium: Left atrial size was mildly dilated. Right Atrium: Right atrial size was normal in size Pericardium: There is no evidence of pericardial effusion. Mitral Valve: The mitral valve was not well visualized. No evidence of mitral valve regurgitation. Tricuspid Valve: The tricuspid valve is not well  visualized. Tricuspid valve regurgitation is not demonstrated. Aortic Valve: The aortic valve was not well visualized. . There is mild thickening of the aortic valve. Aortic valve regurgitation is not visualized. Mild aortic valve sclerosis is present, with no evidence of aortic valve stenosis. There is mild thickening of the aortic valve. Pulmonic Valve: The pulmonic valve was not well visualized. Pulmonic valve regurgitation is not visualized. Pulmonic regurgitation is not visualized. Aorta: The aortic root and ascending aorta are structurally normal, with no evidence of dilitation. IAS/Shunts: The atrial septum is grossly normal. Additional Comments: Technically  difficult echo. Poor image quality.  LEFT VENTRICLE PLAX 2D LVIDd:         2.80 cm LVIDs:         1.90 cm LV PW:         1.20 cm LV IVS:        1.20 cm LVOT diam:     2.10 cm LV SV:         18 ml LV SV Index:   10.41 LVOT Area:     3.46 cm  RIGHT VENTRICLE TAPSE (M-mode): 1.7 cm LEFT ATRIUM             Index       RIGHT ATRIUM           Index LA diam:        4.00 cm 2.31 cm/m  RA Area:     14.20 cm LA Vol (A2C):   58.6 ml 33.85 ml/m RA Volume:   35.70 ml  20.62 ml/m LA Vol (A4C):   75.2 ml 43.44 ml/m LA Biplane Vol: 66.7 ml 38.53 ml/m  AORTIC VALVE LVOT Vmax:   78.30 cm/s LVOT Vmean:  56.000 cm/s LVOT VTI:    0.114 m  AORTA Ao Root diam: 2.60 cm  SHUNTS Systemic VTI:  0.11 m Systemic Diam: 2.10 cm  Kristeen MissPhilip Nahser MD Electronically signed by Kristeen MissPhilip Nahser MD Signature Date/Time: 08/12/2019/3:36:36 PM    Final    VAS US CAROTID  Result Date: 08/13/2019 Carotid Arterial Duplex Study Indications:       CVA and Weakness. Risk Factors:      Hypertension, hyperlipidemia, Diabetes, prior CVA. Comparison Study:  No prior study on file Performing Technologist: Sherren Kernsandace Kanady RVS  Examination Guidelines: A complete evaluation includes B-mode imaging, spectral Doppler, color Doppler, and power Doppler as needed of all accessible portions of each vessel.  Bilateral testing is considered an integral part of a complete examination. Limited examinations for reoccurring indications may be performed as noted.  Right Carotid Findings: +----------+--------+--------+--------+------------------+--------+             PSV cm/s EDV cm/s Stenosis Plaque Description Comments  +----------+--------+--------+--------+------------------+--------+  CCA Prox   81       17                homogeneous                  +----------+--------+--------+--------+------------------+--------+  CCA Distal 156      21                homogeneous                  +----------+--------+--------+--------+------------------+--------+  ICA Prox   96       19                homogeneous        tortuous  +----------+--------+--------+--------+------------------+--------+  ICA Distal 98       19                                   tortuous  +----------+--------+--------+--------+------------------+--------+  ECA        204      10                heterogenous                 +----------+--------+--------+--------+------------------+--------+ +----------+--------+-------+--------+-------------------+  PSV cm/s EDV cms Describe Arm Pressure (mmHG)  +----------+--------+-------+--------+-------------------+  Subclavian 138                                            +----------+--------+-------+--------+-------------------+ +---------+--------+--+--------+--+  Vertebral PSV cm/s 41 EDV cm/s 10  +---------+--------+--+--------+--+  Left Carotid Findings: +----------+--------+--------+--------+------------------+------------------+             PSV cm/s EDV cm/s Stenosis Plaque Description Comments            +----------+--------+--------+--------+------------------+------------------+  CCA Prox   96       13                                   intimal thickening  +----------+--------+--------+--------+------------------+------------------+  CCA Distal 121      16                                   intimal  thickening  +----------+--------+--------+--------+------------------+------------------+  ICA Prox   106      21                heterogenous       tortuous            +----------+--------+--------+--------+------------------+------------------+  ICA Distal 60       10                                   tortuous            +----------+--------+--------+--------+------------------+------------------+  ECA        96       10                                                       +----------+--------+--------+--------+------------------+------------------+ +----------+--------+--------+--------+-------------------+             PSV cm/s EDV cm/s Describe Arm Pressure (mmHG)  +----------+--------+--------+--------+-------------------+  Subclavian 82                                              +----------+--------+--------+--------+-------------------+ +---------+--------+--+--------+-+  Vertebral PSV cm/s 70 EDV cm/s 7  +---------+--------+--+--------+-+  Summary: Right Carotid: Velocities in the right ICA are consistent with a 1-39% stenosis. Left Carotid: Velocities in the left ICA are consistent with a 1-39% stenosis. Vertebrals:  Bilateral vertebral arteries demonstrate antegrade flow. Subclavians: Normal flow hemodynamics were seen in bilateral subclavian              arteries. *See table(s) above for measurements and observations.     Preliminary       Scheduled Meds:  apixaban  5 mg Oral BID   brimonidine  1 drop Both Eyes TID   chlorhexidine  15 mL Mouth/Throat BID   diltiazem  120 mg Oral Daily   feeding supplement (ENSURE ENLIVE)  237 mL Oral BID BM   hydrALAZINE  50 mg Oral TID   hydrochlorothiazide  25 mg Oral Daily   insulin aspart  0-15 Units Subcutaneous TID WC   insulin aspart  0-5 Units Subcutaneous QHS   rosuvastatin  10 mg Oral Daily   Continuous Infusions:   LOS: 2 days      Time spent: 35 minutes   Noralee Stain, DO [facility] 08/14/2019, 1:09 PM    Available via Epic secure chat 7am-7pm After these hours, please refer to coverage provider listed on [email or web address]

## 2019-08-14 NOTE — Progress Notes (Addendum)
ANTICOAGULATION CONSULT NOTE - Follow Up Consult  Pharmacy Consult for Heparin Indication: stroke, CVA  No Known Allergies  Patient Measurements: Height: 5\' 4"  (162.6 cm) Weight: 149 lb 14.6 oz (68 kg) IBW/kg (Calculated) : 54.7 Heparin Dosing Weight: 68  Vital Signs: Temp: 98.3 F (36.8 C) (01/03 1153) Temp Source: Oral (01/03 1153) BP: 190/69 (01/03 1153) Pulse Rate: 67 (01/03 1153)  Labs: Recent Labs    08/11/19 1936 08/11/19 2055 08/12/19 2256 08/13/19 0257 08/14/19 0641  HGB 13.6 15.0  --  12.3 11.9*  HCT 44.8 44.0  --  39.1 38.6  PLT 311  --   --  275 263  APTT 26  --   --   --   --   LABPROT 12.8  --   --   --   --   INR 1.0  --   --   --   --   HEPARINUNFRC  --   --  0.25* 0.55 0.85*  CREATININE 1.08* 1.00  --   --  0.76    Estimated Creatinine Clearance: 51.4 mL/min (by C-G formula based on SCr of 0.76 mg/dL).  Assessment:  Anticoag: Heparin for afib, acute stroke - CBC WNL, no AC PTA. Heparin level up 0.85 from 0.55 (goal 0.3-0.5). No issues with the draw.  Patient is now transitioned to Eliquis 5mg  BID, the heparin drip has been stopped.    Goal of Therapy:  Monitor platelets by anticoagulation protocol: Yes   Plan:  Stop heparin drip Start Eliquis 5mg  BID   10/12/19, PharmD PGY2 Infectious Disease Pharmacy Resident  [facility] 08/14/2019,12:16 PM

## 2019-08-15 ENCOUNTER — Telehealth: Payer: Self-pay | Admitting: *Deleted

## 2019-08-15 LAB — BASIC METABOLIC PANEL
Anion gap: 12 (ref 5–15)
BUN: 12 mg/dL (ref 8–23)
CO2: 23 mmol/L (ref 22–32)
Calcium: 9 mg/dL (ref 8.9–10.3)
Chloride: 105 mmol/L (ref 98–111)
Creatinine, Ser: 0.85 mg/dL (ref 0.44–1.00)
GFR calc Af Amer: >60 mL/min (ref >60)
GFR calc non Af Amer: >60 mL/min (ref >60)
Glucose, Bld: 119 mg/dL — ABNORMAL HIGH (ref 70–99)
Potassium: 3.7 mmol/L (ref 3.5–5.1)
Sodium: 140 mmol/L (ref 135–145)

## 2019-08-15 LAB — MAGNESIUM: Magnesium: 1.9 mg/dL (ref 1.7–2.4)

## 2019-08-15 LAB — CBC
HCT: 38.6 % (ref 36.0–46.0)
Hemoglobin: 12.2 g/dL (ref 12.0–15.0)
MCH: 23.5 pg — ABNORMAL LOW (ref 26.0–34.0)
MCHC: 31.6 g/dL (ref 30.0–36.0)
MCV: 74.4 fL — ABNORMAL LOW (ref 80.0–100.0)
Platelets: 254 10*3/uL (ref 150–400)
RBC: 5.19 MIL/uL — ABNORMAL HIGH (ref 3.87–5.11)
RDW: 16.6 % — ABNORMAL HIGH (ref 11.5–15.5)
WBC: 11.6 10*3/uL — ABNORMAL HIGH (ref 4.0–10.5)
nRBC: 0 % (ref 0.0–0.2)

## 2019-08-15 LAB — GLUCOSE, CAPILLARY
Glucose-Capillary: 106 mg/dL — ABNORMAL HIGH (ref 70–99)
Glucose-Capillary: 89 mg/dL (ref 70–99)
Glucose-Capillary: 92 mg/dL (ref 70–99)
Glucose-Capillary: 115 mg/dL — ABNORMAL HIGH (ref 70–99)

## 2019-08-15 MED ORDER — IPRATROPIUM-ALBUTEROL 0.5-2.5 (3) MG/3ML IN SOLN: 3.0000 mL | Freq: Four times a day (QID) | RESPIRATORY_TRACT | Status: DC | PRN

## 2019-08-15 NOTE — Progress Notes (Signed)
Inpatient Rehabilitation-Admissions Coordinator   Met with pt at the bedside for rehab assessment and to discuss recommended CIR program. Pt very interested but does have concerns about DC support and wanted me to speak with her daughter Gabriel Cirri who lives in [place]. With permission I spoke with Gabriel Cirri over the phone. It was discussed that Gabriel Cirri has plans to leave the country next week and will be away until the middle of February. Unfortunately, Gabriel Cirri says the patient's last residence at her daughter-in-law's home is not conducive to her current needs. Gabriel Cirri understands that due to the length of time the patient will be without caregiver support is not conducive to a CIR stay as we are short term. AC recommends SNF to allow pt to stay for a longer rehab program until pt is able to return to her daughter's place in [place].  AC will sign off and will contact TOC team for assistance with SNF placement.   Please call if questions.  Raechel Ache, OTR/L  Rehab Admissions Coordinator  [phone] 08/15/2019 3:30 PM

## 2019-08-15 NOTE — Progress Notes (Addendum)
CSW alerted by rehab admissions that patient will need SNF. CSW completed referral and faxed out for offers. Per rehab admissions coordinator, daughter to break the news to the patient tonight, requests that CSW not reach out until tomorrow so she has time to discuss with the patient herself first. Full assessment to follow after discussion with patient and daughter tomorrow.  Blenda Nicely, [place] Clinical Social Worker [phone]

## 2019-08-15 NOTE — Telephone Encounter (Signed)
Copied from CRM [id number]. Topic: General - Other >> Aug 11, 2019  4:29 PM Marylen Ponto wrote: Reason for CRM: Pt family member called to request an appt for a cortisone shot as early as possible on Tuesday 08/16/19. Pt family requests that pt be contacted once the doctor approves order for the shot.

## 2019-08-15 NOTE — Consult Note (Signed)
Physical Medicine and Rehabilitation Consult Reason for Consult: Left side weakness and slurred speech Referring Physician: [facility]   HPI: Rhonda Golden is a 85 y.o. right-handed female with history of diabetes mellitus, hypertension, hyperlipidemia, CVA 2013 maintained on Plavix, PAD status post unknown stenting in [place],.  Per chart review lives with son and daughter-in-law.  1 level home 3 steps to entry.  Independent with assistive device.  Presented 08/11/2018 with left-sided weakness.  Cranial CT scan showed no evidence of acute intracranial abnormality.  Chronic right PCA vascular territory cortically based infarction.  Patient did not receive TPA.  MRI of the brain showed a 12 x 10 mm acute infarction within the right ventral pons.  Carotid Dopplers with no ICA stenosis.  Echocardiogram with ejection fraction of 65% without emboli.  No significant valvular abnormalities.  Admission chemistries BUN 24, creatinine 1.08, SARS coronavirus negative.  Hospital course new onset atrial fibrillation follow-up cardiology services maintained on Eliquis as well as the addition of Cardizem.  Neurology service follow-up in regards to CVA advised continue Eliquis.  Tolerating a mechanical soft diet.  Therapy evaluations completed with recommendations of physical medicine rehab consult.   Review of Systems  Constitutional: Negative for fever.  HENT: Negative for hearing loss.   Eyes: Negative for blurred vision and double vision.  Respiratory: Negative for cough and shortness of breath.   Cardiovascular: Positive for palpitations.  Gastrointestinal: Positive for constipation. Negative for heartburn, nausea and vomiting.  Genitourinary: Negative for dysuria and hematuria.  Musculoskeletal: Positive for myalgias.  Skin: Negative for rash.  Neurological: Positive for speech change and weakness.  All other systems reviewed and are negative.  Past Medical History:  Diagnosis Date   CVA (cerebral  vascular accident) (HCC)    2013; 2021   Diabetes mellitus (HCC)    Hyperlipidemia    Hypertension    PAD (peripheral artery disease) (HCC)    leg stents   History reviewed. No pertinent surgical history. Family History  Problem Relation Age of Onset   Hypertension Mother    Hypertension Father    Social History:  reports that she has never smoked. She has never used smokeless tobacco. No history on file for alcohol and drug. Allergies: No Known Allergies Medications Prior to Admission  Medication Sig Dispense Refill   ALPHAGAN P 0.1 % SOLN Place 1 drop into both eyes See admin instructions. unknown     amLODipine (NORVASC) 10 MG tablet Take 1 tablet (10 mg total) by mouth daily. 90 tablet 2   cholecalciferol (VITAMIN D) 25 MCG (1000 UT) tablet Take 1,000 Units by mouth daily.     clopidogrel (PLAVIX) 75 MG tablet Take 1 tablet (75 mg total) by mouth daily. 90 tablet 2   hydrALAZINE (APRESOLINE) 50 MG tablet Take 1 tablet (50 mg total) by mouth 3 (three) times daily. 270 tablet 2   hydrochlorothiazide (HYDRODIURIL) 25 MG tablet Take 1 tablet (25 mg total) by mouth daily. 90 tablet 2   metFORMIN (GLUCOPHAGE) 500 MG tablet Take 1 tablet (500 mg total) by mouth daily. 90 tablet 2   Multiple Vitamins-Minerals (MULTIVITAMIN WITH MINERALS) tablet Take 1 tablet by mouth daily.     omega-3 acid ethyl esters (LOVAZA) 1 g capsule Take 1 g by mouth daily.     predniSONE (DELTASONE) 20 MG tablet Take 2 tablets (40 mg total) by mouth daily with breakfast for 5 days. 10 tablet 0   Propylene Glycol-Glycerin (SOOTHE) 0.6-0.6 % SOLN Apply  to eye.     rosuvastatin (CRESTOR) 10 MG tablet Take 1 tablet (10 mg total) by mouth daily. 90 tablet 2    Home: Home Living Family/patient expects to be discharged to:: Private residence Living Arrangements: Children(daughter in Social workerlaw) Available Help at Discharge: Family, Available PRN/intermittently Type of Home: House Home Access: Stairs to  enter Secretary/administratorntrance Stairs-Number of Steps: 3 Home Layout: One level Bathroom Shower/Tub: Engineer, manufacturing systemsTub/shower unit Bathroom Toilet: Standard Home Equipment: Environmental consultantWalker - 2 wheels, Medical laboratory scientific officerCane - single point  Lives With: Son(DIL)  Functional History: Prior Function Level of Independence: Independent with assistive device(s) Comments: Uses cane and RW for mobility. Performs BADLs and some IADLs including cooking and medication management Functional Status:  Mobility: Bed Mobility Overal bed mobility: Needs Assistance Bed Mobility: Sit to Supine, Supine to Sit Supine to sit: Max assist Sit to supine: Max assist General bed mobility comments: Cues for hooking RLE under LLE to progress off edge of bed and reaching for left railing; maxA for trunk elevation to upright and use of bed pad to scoot hips forward to edge of bed. MaxA for leg negotiation back into bed and to reposition. Transfers Overall transfer level: Needs assistance Equipment used: Rolling walker (2 wheeled) Transfers: Sit to/from Stand Sit to Stand: Mod assist, +2 physical assistance General transfer comment: ModA + 2 to stand from edge of bed with good initiation; cues for hand placement (left hand placed on walker and right hand pushed from bed). Pre gait training including weight shifting and side steps. Pt with decreased ability to weight shift, erratic placement of left foot, increased time/effort for sequencing. Tactile cues provided for decreased left knee hyperextension.       ADL: ADL Overall ADL's : Needs assistance/impaired Eating/Feeding: Moderate assistance, Sitting Eating/Feeding Details (indicate cue type and reason): Pt requiring increased assistance for incorporating LUE into task. Max hand over hand to use LUE to bring cup with straw to mouth. Pt able to bring cup to mouth with right hand.   Grooming: Moderate assistance, Sitting Grooming Details (indicate cue type and reason): Mod A for bilateral tasks Upper Body Bathing:  Moderate assistance, Sitting, Bed level Lower Body Bathing: Maximal assistance, Sit to/from stand, +2 for physical assistance Upper Body Dressing : Moderate assistance, Sitting, Bed level Lower Body Dressing: Maximal assistance, Sit to/from stand, +2 for physical assistance Toilet Transfer: Moderate assistance, +2 for physical assistance(Simulated in room) Functional mobility during ADLs: Moderate assistance, +2 for physical assistance, +2 for safety/equipment, Rolling walker  Cognition: Cognition Overall Cognitive Status: Impaired/Different from baseline Arousal/Alertness: Awake/alert Orientation Level: Oriented X4 Attention: Sustained Sustained Attention: Impaired Sustained Attention Impairment: Verbal basic Memory: Impaired Memory Impairment: Storage deficit, Decreased short term memory, Decreased recall of new information Decreased Short Term Memory: Verbal basic, Verbal complex Awareness: Appears intact Problem Solving: Impaired Problem Solving Impairment: Verbal complex Executive Function: Sequencing Sequencing: Impaired Sequencing Impairment: Verbal complex Cognition Arousal/Alertness: Awake/alert Behavior During Therapy: WFL for tasks assessed/performed Overall Cognitive Status: Impaired/Different from baseline Area of Impairment: Memory, Problem solving, Attention Current Attention Level: Sustained Memory: Decreased short-term memory Problem Solving: Difficulty sequencing, Requires verbal cues, Slow processing General Comments: Pt A&Ox4, mild slurred speech, requires cues for problem solving with mobility tasks. Requiring increased time with ADLs demosntrating slow processing  Blood pressure (!) 184/72, pulse 63, temperature 98.4 F (36.9 C), temperature source Axillary, resp. rate 17, height 5\' 4"  (1.626 m), weight 68 kg, SpO2 95 %. Physical Exam  Neurological:  Patient is alert somewhat anxious.  Makes good eye  contact with examiner.  Follows simple commands.  Speech  is dysarthric with facial droop.  Limited medical historian.   General: No acute distress Mood and affect are appropriate Heart: Regular rate and rhythm no rubs murmurs or extra sounds Lungs: Clear to auscultation, breathing unlabored, no rales or wheezes Abdomen: Positive bowel sounds, soft nontender to palpation, nondistended Extremities: No clubbing, cyanosis, or edema Skin: No evidence of breakdown, no evidence of rash Neurologic: Cranial nerves II through XII intact, motor strength is 5/5 in Right deltoid, bicep, tricep, grip, hip flexor, knee extensors, ankle dorsiflexor and plantar flexor 2- Left UE biceps triceps, trace Left FE and WE, 3- at delt, 3- L HF, KE, trace left ADF Sensory exam normal sensation to light touch and proprioception in bilateral upper and lower extremities Cerebellar exam normal finger to nose to finger Right side, Left side NT due to weakness Visual field testing shows HH on Left  Musculoskeletal: Full range of motion in all 4 extremities. No joint swelling  Results for orders placed or performed during the hospital encounter of 08/11/19 (from the past 24 hour(s))  Glucose, capillary     Status: Abnormal   Collection Time: 08/14/19  6:24 AM  Result Value Ref Range   Glucose-Capillary 106 (H) 70 - 99 mg/dL   Comment 1 Notify RN    Comment 2 Document in Chart   Heparin level (unfractionated)     Status: Abnormal   Collection Time: 08/14/19  6:41 AM  Result Value Ref Range   Heparin Unfractionated 0.85 (H) 0.30 - 0.70 IU/mL  CBC     Status: Abnormal   Collection Time: 08/14/19  6:41 AM  Result Value Ref Range   WBC 8.8 4.0 - 10.5 K/uL   RBC 5.02 3.87 - 5.11 MIL/uL   Hemoglobin 11.9 (L) 12.0 - 15.0 g/dL   HCT 98.1 19.1 - 47.8 %   MCV 76.9 (L) 80.0 - 100.0 fL   MCH 23.7 (L) 26.0 - 34.0 pg   MCHC 30.8 30.0 - 36.0 g/dL   RDW 29.5 (H) 62.1 - 30.8 %   Platelets 263 150 - 400 K/uL   nRBC 0.0 0.0 - 0.2 %  Bmet tomorrow 5am     Status: Abnormal   Collection  Time: 08/14/19  6:41 AM  Result Value Ref Range   Sodium 143 135 - 145 mmol/L   Potassium 3.4 (L) 3.5 - 5.1 mmol/L   Chloride 111 98 - 111 mmol/L   CO2 22 22 - 32 mmol/L   Glucose, Bld 104 (H) 70 - 99 mg/dL   BUN 11 8 - 23 mg/dL   Creatinine, Ser 6.57 0.44 - 1.00 mg/dL   Calcium 8.7 (L) 8.9 - 10.3 mg/dL   GFR calc non Af Amer >60 >60 mL/min   GFR calc Af Amer >60 >60 mL/min   Anion gap 10 5 - 15  Glucose, capillary     Status: Abnormal   Collection Time: 08/14/19 12:02 PM  Result Value Ref Range   Glucose-Capillary 150 (H) 70 - 99 mg/dL   Comment 1 Notify RN    Comment 2 Document in Chart   Glucose, capillary     Status: Abnormal   Collection Time: 08/14/19  5:18 PM  Result Value Ref Range   Glucose-Capillary 115 (H) 70 - 99 mg/dL   Comment 1 Notify RN    Comment 2 Document in Chart   Glucose, capillary     Status: Abnormal   Collection Time: 08/14/19  9:38 PM  Result Value Ref Range   Glucose-Capillary 123 (H) 70 - 99 mg/dL   Comment 1 Notify RN    Comment 2 Document in Chart   CBC     Status: Abnormal   Collection Time: 08/15/19  3:50 AM  Result Value Ref Range   WBC 11.6 (H) 4.0 - 10.5 K/uL   RBC 5.19 (H) 3.87 - 5.11 MIL/uL   Hemoglobin 12.2 12.0 - 15.0 g/dL   HCT 38.6 36.0 - 46.0 %   MCV 74.4 (L) 80.0 - 100.0 fL   MCH 23.5 (L) 26.0 - 34.0 pg   MCHC 31.6 30.0 - 36.0 g/dL   RDW 16.6 (H) 11.5 - 15.5 %   Platelets 254 150 - 400 K/uL   nRBC 0.0 0.0 - 0.2 %  BMP tmrw AM     Status: Abnormal   Collection Time: 08/15/19  3:50 AM  Result Value Ref Range   Sodium 140 135 - 145 mmol/L   Potassium 3.7 3.5 - 5.1 mmol/L   Chloride 105 98 - 111 mmol/L   CO2 23 22 - 32 mmol/L   Glucose, Bld 119 (H) 70 - 99 mg/dL   BUN 12 8 - 23 mg/dL   Creatinine, Ser 0.85 0.44 - 1.00 mg/dL   Calcium 9.0 8.9 - 10.3 mg/dL   GFR calc non Af Amer >60 >60 mL/min   GFR calc Af Amer >60 >60 mL/min   Anion gap 12 5 - 15  Magnesium in AM     Status: None   Collection Time: 08/15/19  3:50 AM    Result Value Ref Range   Magnesium 1.9 1.7 - 2.4 mg/dL   VAS US CAROTID  Result Date: 08/13/2019 Carotid Arterial Duplex Study Indications:       CVA and Weakness. Risk Factors:      Hypertension, hyperlipidemia, Diabetes, prior CVA. Comparison Study:  No prior study on file Performing Technologist: Sharion Dove RVS  Examination Guidelines: A complete evaluation includes B-mode imaging, spectral Doppler, color Doppler, and power Doppler as needed of all accessible portions of each vessel. Bilateral testing is considered an integral part of a complete examination. Limited examinations for reoccurring indications may be performed as noted.  Right Carotid Findings: +----------+--------+--------+--------+------------------+--------+             PSV cm/s EDV cm/s Stenosis Plaque Description Comments  +----------+--------+--------+--------+------------------+--------+  CCA Prox   81       17                homogeneous                  +----------+--------+--------+--------+------------------+--------+  CCA Distal 156      21                homogeneous                  +----------+--------+--------+--------+------------------+--------+  ICA Prox   96       19                homogeneous        tortuous  +----------+--------+--------+--------+------------------+--------+  ICA Distal 98       19                                   tortuous  +----------+--------+--------+--------+------------------+--------+  ECA        204  10                heterogenous                 +----------+--------+--------+--------+------------------+--------+ +----------+--------+-------+--------+-------------------+             PSV cm/s EDV cms Describe Arm Pressure (mmHG)  +----------+--------+-------+--------+-------------------+  Subclavian 138                                            +----------+--------+-------+--------+-------------------+ +---------+--------+--+--------+--+  Vertebral PSV cm/s 41 EDV cm/s 10   +---------+--------+--+--------+--+  Left Carotid Findings: +----------+--------+--------+--------+------------------+------------------+             PSV cm/s EDV cm/s Stenosis Plaque Description Comments            +----------+--------+--------+--------+------------------+------------------+  CCA Prox   96       13                                   intimal thickening  +----------+--------+--------+--------+------------------+------------------+  CCA Distal 121      16                                   intimal thickening  +----------+--------+--------+--------+------------------+------------------+  ICA Prox   106      21                heterogenous       tortuous            +----------+--------+--------+--------+------------------+------------------+  ICA Distal 60       10                                   tortuous            +----------+--------+--------+--------+------------------+------------------+  ECA        96       10                                                       +----------+--------+--------+--------+------------------+------------------+ +----------+--------+--------+--------+-------------------+             PSV cm/s EDV cm/s Describe Arm Pressure (mmHG)  +----------+--------+--------+--------+-------------------+  Subclavian 82                                              +----------+--------+--------+--------+-------------------+ +---------+--------+--+--------+-+  Vertebral PSV cm/s 70 EDV cm/s 7  +---------+--------+--+--------+-+  Summary: Right Carotid: Velocities in the right ICA are consistent with a 1-39% stenosis. Left Carotid: Velocities in the left ICA are consistent with a 1-39% stenosis. Vertebrals:  Bilateral vertebral arteries demonstrate antegrade flow. Subclavians: Normal flow hemodynamics were seen in bilateral subclavian              arteries. *See table(s) above for measurements and observations.     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 Does  the need for close, 24 hr/day medical supervision in concert with the patient's rehab needs make it unreasonable for this patient to be served in a less intensive setting? Yes 2. 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 Due to bladder management, bowel management, safety, skin/wound care, disease management, medication administration, pain management and patient education, does the patient require 24 hr/day rehab nursing? Yes 4. Does the patient require coordinated care of a physician, rehab nurse, therapy disciplines of PT, OT, SLP to address physical and functional deficits in the context of the above medical diagnosis(es)? Yes Addressing deficits in the following areas: balance, endurance, locomotion, strength, transferring, bowel/bladder control, bathing, dressing, feeding, grooming, toileting, cognition and psychosocial support 5. Can the patient actively participate in an intensive therapy program of at least 3 hrs of therapy per day at least 5 days per week? Yes 6. The potential for patient to make measurable gains while on inpatient rehab is good 7. Anticipated functional outcomes upon discharge from inpatient rehab are min assist  with PT, min assist with OT, supervision with SLP. 8. Estimated rehab length of stay to reach the above functional goals is: 18-21d 9. Anticipated discharge destination: Home 10. Overall Rehab/Functional Prognosis: good  RECOMMENDATIONS: This patient's condition is appropriate for continued rehabilitative care in the following setting: CIR Patient has agreed to participate in recommended program. Yes Note that insurance prior authorization may be required for reimbursement for recommended care.  Comment: pt was living with daughter in law, wishes to return to daughter's home post d/c  Charlton Amor, PA-C  "I have personally  performed a face to face diagnostic evaluation of this patient.  Additionally, I have reviewed and concur with the physician assistant's documentation above."  Erick Colace M.D. [facility] FAAPM&R (Sports Med, Neuromuscular Med) Diplomate Am Board of Electrodiagnostic Med  08/15/2019

## 2019-08-15 NOTE — Discharge Instructions (Addendum)

## 2019-08-15 NOTE — Progress Notes (Addendum)
PROGRESS NOTE    Rhonda Golden  BBC:[id number] DOB: Apr 15, 1935 DOA: 08/11/2019 PCP: Shelda Pal, DO     Brief Narrative:  Rhonda Golden is a 85 y.o. femalewith medical history significant ofHTN; HLD;DM; PAD s/p leg stents. Patient presented secondary to concern for stroke with symptoms of difficulty walking, dragging her leg, slurred speech.  Work-up has revealed acute stroke. While admitted, she had an episode of atrial fibrillation with RVR managed on diltiazem drip and started on heparin drip. Neurology as well as cardiology were consulted.    New events last 24 hours / Subjective: She states that she has intermittent wheezing in her chest, sometimes feels that she has hard time talking during wheezing episodes.  These have been intermittent since admission  Assessment & Plan:   Principal Problem:   Right pontine CVA ([place]) Active Problems:   Diabetes mellitus type 2 in nonobese [facility])   PAD (peripheral artery disease) ([place])   Hyperlipidemia   Essential hypertension   Acute stroke -MRI significant for 1.2 x 1 cm right ventral pontine stroke. LDL of 60. Previous hemoglobin A1C 6.2. Transthoracic Echocardiogram significant for normal EF and no embolic source (poor image quality). Atrial fibrillation captured on telemetry. Started on heparin drip and now on Eliquis -Appreciate neurology  Paroxysmal atrial fibrillation -Newly diagnosed. Found to have a rhythm consisted with atrial fibrillation and rates as high as 120-130s. Started on Diltiazem drip without bolus. Rhythm currently sinus with controlled rates. Transthoracic Echocardiogram obtained and significant for normal EF and mild left atrium dilation. -Was on Cardizem drip, converted to normal sinus.  Now on oral Cardizem -Appreciate cardiology  Essential hypertension -Patient is on amlodipine and hydrochlorothiazide as an outpatient. In setting of stroke, neurology recommending permissive hypertension -Continue  diltiazem as mentioned above, resume home HCTZ  Hyperlipidemia -Continue Crestor  PAD -Per chart review, patient is s/p stents in right leg in [place]; unsure of exact procedure. Patient is on Plavix as an outpatient, now on Eliquis    In agreement with assessment of the pressure ulcer as below:  Pressure Injury 08/12/19 Foot Right;Left Pink blanchable heels, lateral, medial and toes (Active)  08/12/19 1808  Location: Foot  Location Orientation: Right;Left  Staging:   Wound Description (Comments): Pink blanchable heels, lateral, medial and toes  Present on Admission:     DVT prophylaxis: Eliquis Code Status: Full code Family Communication: No family at bedside; updated daughter over the phone Disposition Plan: CIR insurance [place] pending    Consultants:   Neurology  Cardiology   Antimicrobials:  Anti-infectives (From admission, onward)   None       Objective: Vitals:   08/15/19 0850 08/15/19 0855 08/15/19 1000 08/15/19 1029  BP:  (!) 201/78  (!) 187/79  Pulse:  79 81 88  Resp:  20 18 20   Temp: 99.1 F (37.3 C)     TempSrc: Oral     SpO2:  98% 98% 96%  Weight:      Height:        Intake/Output Summary (Last 24 hours) at 08/15/2019 1043 Last data filed at 08/14/2019 1500 Gross per 24 hour  Intake --  Output 550 ml  Net -550 ml   Filed Weights   08/12/19 2300  Weight: 68 kg    Examination: General exam: Appears calm and comfortable  Respiratory system: Clear to auscultation. Respiratory effort normal.  No wheezes appreciated on examination today. Cardiovascular system: S1 & S2 heard, RRR. No pedal edema. Gastrointestinal system: Abdomen is nondistended,  soft and nontender. Normal bowel sounds heard. Central nervous system: Alert and oriented.  With continued left upper extremity weakness compared to right Extremities: Symmetric in appearance bilaterally  Skin: No rashes, lesions or ulcers on exposed skin  Psychiatry: Judgement and insight appear  stable. Mood & affect appropriate.    Data Reviewed: I have personally reviewed following labs and imaging studies  CBC: Recent Labs  Lab 08/11/19 1936 08/11/19 2055 08/13/19 0257 08/14/19 0641 08/15/19 0350  WBC 10.3  --  9.7 8.8 11.6*  NEUTROABS 9.6*  --   --   --   --   HGB 13.6 15.0 12.3 11.9* 12.2  HCT 44.8 44.0 39.1 38.6 38.6  MCV 76.7*  --  74.9* 76.9* 74.4*  PLT 311  --  275 263 254   Basic Metabolic Panel: Recent Labs  Lab 08/11/19 1936 08/11/19 2055 08/14/19 0641 08/15/19 0350  NA 140 142 143 140  K 3.8 3.7 3.4* 3.7  CL 106 108 111 105  CO2 21*  --  22 23  GLUCOSE 167* 157* 104* 119*  BUN 24* 25* 11 12  CREATININE 1.08* 1.00 0.76 0.85  CALCIUM 9.5  --  8.7* 9.0  MG  --   --   --  1.9   GFR: Estimated Creatinine Clearance: 48.3 mL/min (by C-G formula based on SCr of 0.85 mg/dL). Liver Function Tests: Recent Labs  Lab 08/11/19 1936  AST 19  ALT 14  ALKPHOS 97  BILITOT 0.3  PROT 7.9  ALBUMIN 3.9   No results for input(s): LIPASE, AMYLASE in the last 168 hours. No results for input(s): AMMONIA in the last 168 hours. Coagulation Profile: Recent Labs  Lab 08/11/19 1936  INR 1.0   Cardiac Enzymes: No results for input(s): CKTOTAL, CKMB, CKMBINDEX, TROPONINI in the last 168 hours. BNP (last 3 results) No results for input(s): PROBNP in the last 8760 hours. HbA1C: Recent Labs    08/13/19 0257  HGBA1C 6.2*   CBG: Recent Labs  Lab 08/14/19 0624 08/14/19 1202 08/14/19 1718 08/14/19 2138 08/15/19 0621  GLUCAP 106* 150* 115* 123* 106*   Lipid Profile: Recent Labs    08/13/19 0300  CHOL 135  HDL 61  LDLCALC 60  TRIG 71  CHOLHDL 2.2   Thyroid Function Tests: No results for input(s): TSH, T4TOTAL, FREET4, T3FREE, THYROIDAB in the last 72 hours. Anemia Panel: No results for input(s): VITAMINB12, FOLATE, FERRITIN, TIBC, IRON, RETICCTPCT in the last 72 hours. Sepsis Labs: No results for input(s): PROCALCITON, LATICACIDVEN in the last  168 hours.  Recent Results (from the past 240 hour(s))  SARS CORONAVIRUS 2 (TAT 6-24 HRS) Nasopharyngeal Nasopharyngeal Swab     Status: None   Collection Time: 08/12/19 11:02 AM   Specimen: Nasopharyngeal Swab  Result Value Ref Range Status   SARS Coronavirus 2 NEGATIVE NEGATIVE Final    Comment: (NOTE) SARS-CoV-2 target nucleic acids are NOT DETECTED. The SARS-CoV-2 RNA is generally detectable in upper and lower respiratory specimens during the acute phase of infection. Negative results do not preclude SARS-CoV-2 infection, do not rule out co-infections with other pathogens, and should not be used as the sole basis for treatment or other patient management decisions. Negative results must be combined with clinical observations, patient history, and epidemiological information. The expected result is Negative. Fact Sheet for Patients: HairSlick.no Fact Sheet for Healthcare Providers: quierodirigir.com This test is not yet approved or cleared by the [place] FDA and  has been authorized for detection and/or diagnosis of  SARS-CoV-2 by FDA under an Emergency Use Authorization (EUA). This EUA will remain  in effect (meaning this test can be used) for the duration of the COVID-19 declaration under Section 56 4(b)(1) of the Act, 21 U.S.C. section 360bbb-3(b)(1), unless the authorization is terminated or revoked sooner. Performed at [facility], [address] [place], [place] [address]       Radiology Studies: VAS US CAROTID  Result Date: 08/13/2019 Carotid Arterial Duplex Study Indications:       CVA and Weakness. Risk Factors:      Hypertension, hyperlipidemia, Diabetes, prior CVA. Comparison Study:  No prior study on file Performing Technologist: Sherren Kerns RVS  Examination Guidelines: A complete evaluation includes B-mode imaging, spectral Doppler, color Doppler, and power Doppler as needed of all accessible  portions of each vessel. Bilateral testing is considered an integral part of a complete examination. Limited examinations for reoccurring indications may be performed as noted.  Right Carotid Findings: +----------+--------+--------+--------+------------------+--------+           PSV cm/sEDV cm/sStenosisPlaque DescriptionComments +----------+--------+--------+--------+------------------+--------+ CCA Prox  81      17              homogeneous                +----------+--------+--------+--------+------------------+--------+ CCA Distal156     21              homogeneous                +----------+--------+--------+--------+------------------+--------+ ICA Prox  96      19              homogeneous       tortuous +----------+--------+--------+--------+------------------+--------+ ICA Distal98      19                                tortuous +----------+--------+--------+--------+------------------+--------+ ECA       204     10              heterogenous               +----------+--------+--------+--------+------------------+--------+ +----------+--------+-------+--------+-------------------+           PSV cm/sEDV cmsDescribeArm Pressure (mmHG) +----------+--------+-------+--------+-------------------+ [id number]                                        +----------+--------+-------+--------+-------------------+ +---------+--------+--+--------+--+ VertebralPSV cm/s41EDV cm/s10 +---------+--------+--+--------+--+  Left Carotid Findings: +----------+--------+--------+--------+------------------+------------------+           PSV cm/sEDV cm/sStenosisPlaque DescriptionComments           +----------+--------+--------+--------+------------------+------------------+ CCA Prox  96      13                                intimal thickening +----------+--------+--------+--------+------------------+------------------+ CCA Distal121     16                                 intimal thickening +----------+--------+--------+--------+------------------+------------------+ ICA Prox  106     21              heterogenous      tortuous           +----------+--------+--------+--------+------------------+------------------+ ICA Distal60  10                                tortuous           +----------+--------+--------+--------+------------------+------------------+ ECA       96      10                                                   +----------+--------+--------+--------+------------------+------------------+ +----------+--------+--------+--------+-------------------+           PSV cm/sEDV cm/sDescribeArm Pressure (mmHG) +----------+--------+--------+--------+-------------------+ [id number]                                          +----------+--------+--------+--------+-------------------+ +---------+--------+--+--------+-+ VertebralPSV cm/s70EDV cm/s7 +---------+--------+--+--------+-+  Summary: Right Carotid: Velocities in the right ICA are consistent with a 1-39% stenosis. Left Carotid: Velocities in the left ICA are consistent with a 1-39% stenosis. Vertebrals:  Bilateral vertebral arteries demonstrate antegrade flow. Subclavians: Normal flow hemodynamics were seen in bilateral subclavian              arteries. *See table(s) above for measurements and observations.     Preliminary       Scheduled Meds: . apixaban  5 mg Oral BID  . brimonidine  1 drop Both Eyes TID  . chlorhexidine  15 mL Mouth/Throat BID  . diltiazem  120 mg Oral Daily  . feeding supplement (ENSURE ENLIVE)  237 mL Oral BID BM  . hydrALAZINE  50 mg Oral TID  . hydrochlorothiazide  25 mg Oral Daily  . insulin aspart  0-15 Units Subcutaneous TID WC  . insulin aspart  0-5 Units Subcutaneous QHS  . rosuvastatin  10 mg Oral Daily   Continuous Infusions:   LOS: 3 days      Time spent: 30 minutes   Noralee Stain, DO [facility] 08/15/2019,  10:43 AM   Available via Epic secure chat 7am-7pm After these hours, please refer to coverage provider listed on [email or web address]

## 2019-08-15 NOTE — Care Management Important Message (Signed)
Important Message  Patient Details  Name: Rhonda Golden MRN: [id number] Date of Birth: 14-Jul-1935   Medicare Important Message Given:  Yes     Gianny Killman Stefan Church 08/15/2019, 3:47 PM

## 2019-08-15 NOTE — Telephone Encounter (Signed)
Let me know if ok and will schedule

## 2019-08-15 NOTE — NC FL2 (Signed)
[place] LEVEL OF CARE SCREENING TOOL     IDENTIFICATION  Patient Name: Rhonda Golden Birthdate: [birth year] Sex: female Admission Date (Current Location): 08/11/2019  [facility] and [place] Number:  Herbalist and Address:  [facility], [place] [address], [place], [place] [address]      Provider Number: [id number]  Attending Physician Name and Address:  Dessa Phi, DO  Relative Name and Phone Number:       Current Level of Care: Hospital Recommended Level of Care: [place] Prior Approval Number:    Date Approved/Denied:   PASRR Number: [id number]  Discharge Plan: SNF    Current Diagnoses: Patient Active Problem List   Diagnosis Date Noted  . Right pontine CVA ([place]) 08/12/2019  . Diabetes mellitus type 2 in nonobese ([place]) 05/18/2019  . PAD (peripheral artery disease) ([place]) 05/18/2019  . Hyperlipidemia 05/18/2019  . Essential hypertension 05/18/2019    Orientation RESPIRATION BLADDER Height & Weight     Self, Situation, Time, Place  Normal Continent Weight: 149 lb 14.6 oz (68 kg) Height:  5\' 4"  (162.6 cm)  BEHAVIORAL SYMPTOMS/MOOD NEUROLOGICAL BOWEL NUTRITION STATUS      Continent Diet(see DC summary)  AMBULATORY STATUS COMMUNICATION OF NEEDS Skin   Extensive Assist Verbally Normal                       Personal Care Assistance Level of Assistance  Bathing, Feeding, Dressing Bathing Assistance: Limited assistance Feeding assistance: Independent Dressing Assistance: Limited assistance     Functional Limitations Info  Hearing, Sight Sight Info: Impaired Hearing Info: Impaired      SPECIAL CARE FACTORS FREQUENCY  PT (By licensed PT), OT (By licensed OT), Speech therapy     PT Frequency: 5x/wk OT Frequency: 5x/wk     Speech Therapy Frequency: 5x/wk      Contractures Contractures Info: Not present    Additional Factors Info  Code Status, Allergies, Insulin Sliding Scale Code  Status Info: Full Allergies Info: NKA   Insulin Sliding Scale Info: 0-15 units 3x/day with meals; 0-5 units daily at bed       Current Medications (08/15/2019):  This is the current hospital active medication list Current Facility-Administered Medications  Medication Dose Route Frequency Provider Last Rate Last Admin  . acetaminophen (TYLENOL) tablet 650 mg  650 mg Oral Q4H PRN Karmen Bongo, MD       Or  . acetaminophen (TYLENOL) 160 MG/5ML solution 650 mg  650 mg Per Tube Q4H PRN Karmen Bongo, MD   650 mg at 08/15/19 1194   Or  . acetaminophen (TYLENOL) suppository 650 mg  650 mg Rectal Q4H PRN Karmen Bongo, MD      . apixaban Arne [place]) tablet 5 mg  5 mg Oral BID Dessa Phi, DO   5 mg at 08/15/19 0831  . brimonidine (ALPHAGAN) 0.15 % ophthalmic solution 1 drop  1 drop Both Eyes TID Karmen Bongo, MD   1 drop at 08/15/19 [phone]  . chlorhexidine (PERIDEX) 0.12 % solution 15 mL  15 mL Mouth/Throat BID Mariel Aloe, MD   15 mL at 08/15/19 0831  . diltiazem (CARDIZEM CD) 24 hr capsule 120 mg  120 mg Oral Daily Dessa Phi, DO   120 mg at 08/15/19 0830  . feeding supplement (ENSURE ENLIVE) (ENSURE ENLIVE) liquid 237 mL  237 mL Oral BID BM Mariel Aloe, MD   237 mL at 08/15/19 1311  . haloperidol lactate (  HALDOL) injection 2 mg  2 mg Intravenous Q6H PRN Jonah Blue, MD   2 mg at 08/12/19 1154  . hydrALAZINE (APRESOLINE) tablet 50 mg  50 mg Oral TID Noralee Stain, DO   50 mg at 08/15/19 0830  . hydrochlorothiazide (HYDRODIURIL) tablet 25 mg  25 mg Oral Daily Noralee Stain, DO   25 mg at 08/15/19 0830  . insulin aspart (novoLOG) injection 0-15 Units  0-15 Units Subcutaneous TID WC Jonah Blue, MD   2 Units at 08/14/19 1230  . insulin aspart (novoLOG) injection 0-5 Units  0-5 Units Subcutaneous QHS Jonah Blue, MD      . ipratropium-albuterol (DUONEB) 0.5-2.5 (3) MG/3ML nebulizer solution 3 mL  3 mL Nebulization Q6H PRN Noralee Stain, DO      . rosuvastatin  (CRESTOR) tablet 10 mg  10 mg Oral Daily Jonah Blue, MD   10 mg at 08/15/19 0831  . senna-docusate (Senokot-S) tablet 1 tablet  1 tablet Oral QHS PRN Jonah Blue, MD         Discharge Medications: Please see discharge summary for a list of discharge medications.  Relevant Imaging Results:  Relevant Lab Results:   Additional Information SS#: [id number]  Baldemar Lenis, LCSW

## 2019-08-15 NOTE — Progress Notes (Signed)
Physical Therapy Treatment Patient Details Name: Rhonda Golden MRN: [id number] DOB: 03-15-1935 Today's Date: 08/15/2019    History of Present Illness Rhonda Golden is a 85 y.o. female with medical history significant of HTN; HLD; DM; PAD s/p leg stents; and prior h/o CVA (2013) presenting with left lower extremity weakness and slurred speech.  MRI on 08/12/19 revealed "12 x 10 mm acute infarct within the right ventral pons."     PT Comments    Pt progressing with mobility. Initiated gait training using R side rail in hallway. Pt needed mod A to support L side and prevent L knee buckling/ hyperextension and +2 for chair behind. Pt ambulated 5'.  PT will continue to follow.   Follow Up Recommendations  CIR     Equipment Recommendations  Wheelchair (measurements PT);3in1 (PT)    Recommendations for Other Services Rehab consult     Precautions / Restrictions Precautions Precautions: Fall;Other (comment) Precaution Comments: Left hemiplegia; LLE extensor tone Restrictions Weight Bearing Restrictions: No    Mobility  Bed Mobility Overal bed mobility: Needs Assistance Bed Mobility: Supine to Sit     Supine to sit: Mod assist     General bed mobility comments: pt able to grasp L rail with R hand and slide BLE's off bed. Mod A needed for elavtion of trunk from SL to sitting. Pt attempted to scoot to EOB but ineffective with scoot. Needed mod A to complete task of feet to floot  Transfers Overall transfer level: Needs assistance Equipment used: Rolling walker (2 wheeled) Transfers: Sit to/from [employer] Sit to Stand: Mod assist;+2 physical assistance Stand pivot transfers: Mod assist;+2 physical assistance       General transfer comment: mod A +2 for power up, support needed L knee to prevent buckling with stepping to chair  Ambulation/Gait Ambulation/Gait assistance: Mod assist;+2 safety/equipment Gait Distance (Feet): 5 Feet Assistive device:  None(railing) Gait Pattern/deviations: Step-to pattern;Decreased weight shift to left;Decreased stance time - left;Decreased step length - left Gait velocity: decreased Gait velocity interpretation: <1.31 ft/sec, indicative of household ambulator General Gait Details: mod A L side for ambulation with support to L knee to prevent buckling and hyperextension. Pt very insecure stepping RLE at first but improved with practice.    Stairs             Wheelchair Mobility    Modified Rankin (Stroke Patients Only) Modified Rankin (Stroke Patients Only) Pre-Morbid Rankin Score: Slight disability Modified Rankin: Moderately severe disability     Balance Overall balance assessment: Needs assistance Sitting-balance support: Feet unsupported Sitting balance-Leahy Scale: Fair Sitting balance - Comments: able to sit unsupported but limitations evident when trying to scoot. Decreased trunk activation noted   Standing balance support: Bilateral upper extremity supported Standing balance-Leahy Scale: Poor Standing balance comment: reliant on external support                            Cognition Arousal/Alertness: Awake/alert Behavior During Therapy: WFL for tasks assessed/performed Overall Cognitive Status: Impaired/Different from baseline Area of Impairment: Memory;Problem solving;Attention                   Current Attention Level: Sustained Memory: Decreased short-term memory       Problem Solving: Difficulty sequencing;Requires verbal cues;Slow processing General Comments: Pt A&Ox4, requires cues for problem solving and increased time for processing      Exercises General Exercises - Lower Extremity Long Arc Quad: AROM;Left;10 reps;Seated  General Comments General comments (skin integrity, edema, etc.): worked on engaging abdominals to come to sitting edge of chair from reclined position      Pertinent Vitals/Pain Pain Assessment: Faces Faces Pain  Scale: Hurts a little bit Pain Location: L hand Pain Descriptors / Indicators: Sore Pain Intervention(s): Repositioned;Monitored during session    Home Living                      Prior Function            PT Goals (current goals can now be found in the care plan section) Acute Rehab PT Goals Patient Stated Goal: get stronger PT Goal Formulation: With patient Time For Goal Achievement: 08/27/19 Potential to Achieve Goals: Good Progress towards PT goals: Progressing toward goals    Frequency    Min 4X/week      PT Plan Current plan remains appropriate    Co-evaluation              AM-PAC PT "6 Clicks" Mobility   Outcome Measure  Help needed turning from your back to your side while in a flat bed without using bedrails?: A Lot Help needed moving from lying on your back to sitting on the side of a flat bed without using bedrails?: A Lot Help needed moving to and from a bed to a chair (including a wheelchair)?: A Lot Help needed standing up from a chair using your arms (e.g., wheelchair or bedside chair)?: A Lot Help needed to walk in hospital room?: Total Help needed climbing 3-5 steps with a railing? : Total 6 Click Score: 10    End of Session Equipment Utilized During Treatment: Gait belt Activity Tolerance: Patient tolerated treatment well Patient left: with call bell/phone within reach;in chair;with chair alarm set Nurse Communication: Mobility status PT Visit Diagnosis: Unsteadiness on feet (R26.81);Other abnormalities of gait and mobility (R26.89);Difficulty in walking, not elsewhere classified (R26.2);Other symptoms and signs involving the nervous system (R29.898);Hemiplegia and hemiparesis Hemiplegia - Right/Left: Left Hemiplegia - dominant/non-dominant: Non-dominant Hemiplegia - caused by: Cerebral infarction     Time: 8295-6213 PT Time Calculation (min) (ACUTE ONLY): 30 min  Charges:  $Gait Training: 8-22 mins $Therapeutic Activity: 8-22  mins                     Leighton Roach, PT  Acute Rehab Services  Pager [phone] Office [place] 08/15/2019, 2:01 PM

## 2019-08-16 LAB — GLUCOSE, CAPILLARY
Glucose-Capillary: 104 mg/dL — ABNORMAL HIGH (ref 70–99)
Glucose-Capillary: 185 mg/dL — ABNORMAL HIGH (ref 70–99)
Glucose-Capillary: 85 mg/dL (ref 70–99)
Glucose-Capillary: 168 mg/dL — ABNORMAL HIGH (ref 70–99)

## 2019-08-16 MED ORDER — LISINOPRIL 20 MG PO TABS
20.0000 mg | ORAL_TABLET | Freq: Every day | ORAL | Status: DC
  Administered 2019-08-16 – 2019-08-18 (×3): 20 mg via ORAL
  Filled 2019-08-16 (×3): qty 1

## 2019-08-16 NOTE — TOC Initial Note (Signed)
Transition of Care [facility]) - Initial/Assessment Note    Patient Details  Name: Rhonda Golden MRN: [id number] Date of Birth: [birth year]  Transition of [facility]) CM/SW Contact:    Geralynn Ochs, LCSW Phone Number: 08/16/2019, 1:34 PM  Clinical Narrative:    CSW spoke with patient's daughter, Gabriel Cirri, to ensure that patient was aware of SNF placement and in agreement. Gabriel Cirri spoke with patient yesterday evening and she is aware and on board. CSW explained barriers to SNF placement at this time, and provided bed offer to patient's daughter. Daughter to review and get back to CSW with decision. CSW to follow.               Expected Discharge Plan: Skilled Nursing Facility Barriers to Discharge: Continued Medical Work up   Patient Goals and CMS Choice Patient states their goals for this hospitalization and ongoing recovery are:: to be as independent as possible CMS Medicare.gov Compare Post Acute Care list provided to:: Patient Represenative (must comment) Choice offered to / list presented to : Adult Children  Expected Discharge Plan and Services Expected Discharge Plan: [place] Acute Care Choice: [place] Living arrangements for the past 2 months: Single Family Home                                      Prior Living Arrangements/Services Living arrangements for the past 2 months: Single Family Home Lives with:: Self Patient language and need for interpreter reviewed:: No Do you feel safe going back to the place where you live?: Yes      Need for Family Participation in Patient Care: Yes (Comment) Care giver support system in place?: No (comment)   Criminal Activity/Legal Involvement Pertinent to Current Situation/Hospitalization: No - Comment as needed  Activities of Daily Living Home Assistive Devices/Equipment: None ADL Screening (condition at time of admission) Patient's cognitive ability adequate to safely complete daily  activities?: No Is the patient deaf or have difficulty hearing?: Yes Does the patient have difficulty seeing, even when wearing glasses/contacts?: No Does the patient have difficulty concentrating, remembering, or making decisions?: No Patient able to express need for assistance with ADLs?: Yes Does the patient have difficulty dressing or bathing?: Yes Independently performs ADLs?: No Communication: Independent Dressing (OT): Dependent Is this a change from baseline?: Pre-admission baseline Grooming: Needs assistance Is this a change from baseline?: Pre-admission baseline Feeding: Needs assistance Is this a change from baseline?: Pre-admission baseline Bathing: Needs assistance Is this a change from baseline?: Pre-admission baseline Toileting: Needs assistance Is this a change from baseline?: Pre-admission baseline In/Out Bed: Needs assistance Is this a change from baseline?: Pre-admission baseline Walks in Home: Needs assistance Does the patient have difficulty walking or climbing stairs?: Yes Weakness of Legs: Left Weakness of Arms/Hands: Left  Permission Sought/Granted Permission sought to share information with : Facility Sport and exercise psychologist, Family Supports Permission granted to share information with : Yes, Verbal Permission Granted  Share Information with NAME: Gabriel Cirri  Permission granted to share info w AGENCY: SNF  Permission granted to share info w Relationship: Daughter     Emotional Assessment       Orientation: : Oriented to Self, Oriented to Place, Oriented to  Time, Oriented to Situation      Admission diagnosis:  Right pontine CVA ([place]) [I63.50] Cerebral infarction, unspecified mechanism ([place]) [I63.9] Hypertension, unspecified type [I10] Patient Active Problem  List   Diagnosis Date Noted  . Right pontine CVA (HCC) 08/12/2019  . Diabetes mellitus type 2 in nonobese (HCC) 05/18/2019  . PAD (peripheral artery disease) (HCC) 05/18/2019  . Hyperlipidemia  05/18/2019  . Essential hypertension 05/18/2019   PCP:  Sharlene Dory, DO Pharmacy:   [facility] [phone] - [place], [place] - [address] AT [facility] [address] [place] [address] Phone: [phone] Fax: [phone]     Social Determinants of Health (SDOH) Interventions    Readmission Risk Interventions No flowsheet data found.

## 2019-08-16 NOTE — TOC Progression Note (Signed)
Transition of Care [facility]) - Progression Note    Patient Details  Name: Rhonda Golden MRN: [id number] Date of Birth: 1935/07/30  Transition of Care [facility]) CM/SW Contact  Baldemar Lenis, [place] Phone Number: 08/16/2019, 4:09 PM  Clinical Narrative:  CSW spoke with Martie Lee to update her on another bed offer received at [facility]. Martie Lee to research SNF. Martie Lee said she had looked at [facility] and was not happy, said she spoke to family who suggested [facility], [facility], and [facility]. CSW explained to [place] how they were out of network with the patient's insurance. CSW to speak to [place] tomorrow on choice.    Expected Discharge Plan: Skilled Nursing Facility Barriers to Discharge: Continued Medical Work up  Expected Discharge Plan and Services Expected Discharge Plan: Skilled Nursing Facility     Post Acute Care Choice: Skilled Nursing Facility Living arrangements for the past 2 months: Single Family Home                                       Social Determinants of Health (SDOH) Interventions    Readmission Risk Interventions No flowsheet data found.

## 2019-08-16 NOTE — Telephone Encounter (Signed)
Called the family and the patient is in the hospital and may be going to [place] with her daughter

## 2019-08-16 NOTE — Progress Notes (Addendum)
PROGRESS NOTE    Rhonda Golden  HYI:[id number] DOB: 06/24/1935 DOA: 08/11/2019 PCP: Sharlene Dory, DO     Brief Narrative:  Rhonda Golden is a 85 y.o. femalewith medical history significant ofHTN; HLD;DM; PAD s/p leg stents. Patient presented secondary to concern for stroke with symptoms of difficulty walking, dragging her leg, slurred speech.  Work-up has revealed acute stroke. While admitted, she had an episode of atrial fibrillation with RVR managed on diltiazem drip and started on heparin drip. Neurology as well as cardiology were consulted.    New events last 24 hours / Subjective: No complaints this morning.  Has had some left upper extremity discomfort.  Assessment & Plan:   Principal Problem:   Right pontine CVA (HCC) Active Problems:   Diabetes mellitus type 2 in nonobese [facility])   PAD (peripheral artery disease) (HCC)   Hyperlipidemia   Essential hypertension   Acute stroke -MRI significant for 1.2 x 1 cm right ventral pontine stroke. LDL of 60. Previous hemoglobin A1C 6.2. Transthoracic Echocardiogram significant for normal EF and no embolic source (poor image quality). Atrial fibrillation captured on telemetry. Started on heparin drip and now on Eliquis -Appreciate neurology  Paroxysmal atrial fibrillation -Newly diagnosed. Found to have a rhythm consisted with atrial fibrillation and rates as high as 120-130s. Started on Diltiazem drip without bolus. Rhythm currently sinus with controlled rates. Transthoracic Echocardiogram obtained and significant for normal EF and mild left atrium dilation. -Was on Cardizem drip, converted to normal sinus.  Now on oral Cardizem -Appreciate cardiology  Essential hypertension -Patient is on amlodipine, hydrochlorothiazide, hydralazine as an outpatient. In setting of stroke, neurology recommending permissive hypertension -Continue diltiazem, HCTZ, hydralazine. Will add lisinopril   Hyperlipidemia -Continue  Crestor  PAD -Per chart review, patient is s/p stents in right leg in [place]; unsure of exact procedure. Patient is on Plavix as an outpatient, now on Eliquis    In agreement with assessment of the pressure ulcer as below:  Pressure Injury 08/12/19 Foot Right;Left Pink blanchable heels, lateral, medial and toes (Active)  08/12/19 1808  Location: Foot  Location Orientation: Right;Left  Staging:   Wound Description (Comments): Pink blanchable heels, lateral, medial and toes  Present on Admission:     DVT prophylaxis: Eliquis Code Status: Full code Family Communication: No family at bedside Disposition Plan: SNF placement pending   Consultants:   Neurology  Cardiology   Antimicrobials:  Anti-infectives (From admission, onward)   None       Objective: Vitals:   08/15/19 2345 08/16/19 0325 08/16/19 0414 08/16/19 0716  BP: (!) 167/69  (!) 156/48 (!) 196/78  Pulse: 73  63 70  Resp: 19  17 18   Temp: 98.6 F (37 C) 98.7 F (37.1 C)  98.5 F (36.9 C)  TempSrc: Axillary Oral  Oral  SpO2: 96%  97% 94%  Weight:      Height:        Intake/Output Summary (Last 24 hours) at 08/16/2019 0953 Last data filed at 08/16/2019 0400 Gross per 24 hour  Intake 500 ml  Output 900 ml  Net -400 ml   Filed Weights   08/12/19 2300  Weight: 68 kg    Examination: General exam: Appears calm and comfortable  Respiratory system: Clear to auscultation. Respiratory effort normal. Cardiovascular system: S1 & S2 heard, RRR. No pedal edema. Gastrointestinal system: Abdomen is nondistended, soft and nontender. Normal bowel sounds heard. Central nervous system: Alert and oriented.  Left upper extremity weaker compared to right. Speech  clear  Skin: No rashes, lesions or ulcers on exposed skin  Psychiatry: Judgement and insight appear stable. Mood & affect appropriate.    Data Reviewed: I have personally reviewed following labs and imaging studies  CBC: Recent Labs  Lab  08/11/19 1936 08/11/19 2055 08/13/19 0257 08/14/19 0641 08/15/19 0350  WBC 10.3  --  9.7 8.8 11.6*  NEUTROABS 9.6*  --   --   --   --   HGB 13.6 15.0 12.3 11.9* 12.2  HCT 44.8 44.0 39.1 38.6 38.6  MCV 76.7*  --  74.9* 76.9* 74.4*  PLT 311  --  275 263 254   Basic Metabolic Panel: Recent Labs  Lab 08/11/19 1936 08/11/19 2055 08/14/19 0641 08/15/19 0350  NA 140 142 143 140  K 3.8 3.7 3.4* 3.7  CL 106 108 111 105  CO2 21*  --  22 23  GLUCOSE 167* 157* 104* 119*  BUN 24* 25* 11 12  CREATININE 1.08* 1.00 0.76 0.85  CALCIUM 9.5  --  8.7* 9.0  MG  --   --   --  1.9   GFR: Estimated Creatinine Clearance: 48.3 mL/min (by C-G formula based on SCr of 0.85 mg/dL). Liver Function Tests: Recent Labs  Lab 08/11/19 1936  AST 19  ALT 14  ALKPHOS 97  BILITOT 0.3  PROT 7.9  ALBUMIN 3.9   No results for input(s): LIPASE, AMYLASE in the last 168 hours. No results for input(s): AMMONIA in the last 168 hours. Coagulation Profile: Recent Labs  Lab 08/11/19 1936  INR 1.0   Cardiac Enzymes: No results for input(s): CKTOTAL, CKMB, CKMBINDEX, TROPONINI in the last 168 hours. BNP (last 3 results) No results for input(s): PROBNP in the last 8760 hours. HbA1C: No results for input(s): HGBA1C in the last 72 hours. CBG: Recent Labs  Lab 08/15/19 0621 08/15/19 1108 08/15/19 1633 08/15/19 2117 08/16/19 0608  GLUCAP 106* 89 92 115* 104*   Lipid Profile: No results for input(s): CHOL, HDL, LDLCALC, TRIG, CHOLHDL, LDLDIRECT in the last 72 hours. Thyroid Function Tests: No results for input(s): TSH, T4TOTAL, FREET4, T3FREE, THYROIDAB in the last 72 hours. Anemia Panel: No results for input(s): VITAMINB12, FOLATE, FERRITIN, TIBC, IRON, RETICCTPCT in the last 72 hours. Sepsis Labs: No results for input(s): PROCALCITON, LATICACIDVEN in the last 168 hours.  Recent Results (from the past 240 hour(s))  SARS CORONAVIRUS 2 (TAT 6-24 HRS) Nasopharyngeal Nasopharyngeal Swab     Status:  None   Collection Time: 08/12/19 11:02 AM   Specimen: Nasopharyngeal Swab  Result Value Ref Range Status   SARS Coronavirus 2 NEGATIVE NEGATIVE Final    Comment: (NOTE) SARS-CoV-2 target nucleic acids are NOT DETECTED. The SARS-CoV-2 RNA is generally detectable in upper and lower respiratory specimens during the acute phase of infection. Negative results do not preclude SARS-CoV-2 infection, do not rule out co-infections with other pathogens, and should not be used as the sole basis for treatment or other patient management decisions. Negative results must be combined with clinical observations, patient history, and epidemiological information. The expected result is Negative. Fact Sheet for Patients: HairSlick.no Fact Sheet for Healthcare Providers: quierodirigir.com This test is not yet approved or cleared by the [place] FDA and  has been authorized for detection and/or diagnosis of SARS-CoV-2 by FDA under an Emergency Use Authorization (EUA). This EUA will remain  in effect (meaning this test can be used) for the duration of the COVID-19 declaration under Section 56 4(b)(1) of the Act, 21 U.S.C.  section 360bbb-3(b)(1), unless the authorization is terminated or revoked sooner. Performed at [facility], [place] [address], [place], [place] [address]       Radiology Studies: No results found.    Scheduled Meds: . apixaban  5 mg Oral BID  . brimonidine  1 drop Both Eyes TID  . chlorhexidine  15 mL Mouth/Throat BID  . diltiazem  120 mg Oral Daily  . feeding supplement (ENSURE ENLIVE)  237 mL Oral BID BM  . hydrALAZINE  50 mg Oral TID  . hydrochlorothiazide  25 mg Oral Daily  . insulin aspart  0-15 Units Subcutaneous TID WC  . insulin aspart  0-5 Units Subcutaneous QHS  . rosuvastatin  10 mg Oral Daily   Continuous Infusions:   LOS: 4 days      Time spent: 25 minutes   Dessa Phi, DO [facility] 08/16/2019, 9:53 AM   Available via Epic secure chat 7am-7pm After these hours, please refer to coverage provider listed on [email or web address]

## 2019-08-16 NOTE — Progress Notes (Signed)
SLP spoke with this Clinical research associate after doing an evaluation on the patient stating the patient appeared to be fatigued and noted a low BP on (72/48) on telemetry monitor. This writer went to the room to check on patient. Patient is alert and oriented x4, BP 124/52. Patient stated " I just want to take a nap, I had a long night" . This writer placed call bell and phone within reach, bed in lowest position. Will continue to monitor the patient throughout shift.

## 2019-08-16 NOTE — Progress Notes (Signed)
  Speech Language Pathology Treatment: Dysphagia  Patient Details Name: Rhonda Golden MRN: [id number] DOB: [birth year] Today's Date: 08/16/2019 Time: 2620-3559 SLP Time Calculation (min) (ACUTE ONLY): 10 min  Assessment / Plan / Recommendation Clinical Impression  Patient seen at bedside during lunchtime; Pt in bed with HOB raised. Patient with dysphagia 3/thin liquids meal tray.  Upper dentures present and in mouth, lower edentulous which pt reports as her baseline dentition status. Pt appearing fatigued, but agreeable to PO. Pt seen with thin liquids via straw sip and one bite of mashed potatoes: no overt s/s aspiration or distress, good oral clearance. Patient also seen with bites of chopped chicken, good oral acceptance, mildly prolonged mastication and a brief oral hold prior to the swallow. Good oral clearance achieved, no overt s/s aspiration. Pt reporting more fatigue, PO trials stopped as patient stated she wished to get some sleep. BP noted to be 72/48 on telemetry, SLP alerted RN, RN verbalized understanding and went to check on the patient.  RN reports patient has been tolerating swallowing medications well. Recommend to continue dysphagia 3/thin liquids and follow-up briefly for diet tolerance.    HPI HPI: Rhonda Golden is a 85 y.o. female with medical history significant of HTN; HLD; DM; PAD s/p leg stents; and prior h/o CVA (2013) presenting with neurologic symptoms.  MRI on 08/12/19 revealed "12 x 10 mm acute infarct within the right ventral pons." Pt reported that she participated in Speech Therapy for "lots of things" after her initial CVA in 2013.        SLP Plan  Continue with current plan of care       Recommendations  Diet recommendations: Dysphagia 3 (mechanical soft);Thin liquid Liquids provided via: Straw;Cup Medication Administration: Whole meds with puree Supervision: Intermittent supervision to cue for compensatory strategies Compensations: Slow rate;Small  sips/bites;Minimize environmental distractions Postural Changes and/or Swallow Maneuvers: Seated upright 90 degrees                SLP Visit Diagnosis: Dysphagia, unspecified (R13.10) Plan: Continue with current plan of care       GO                Shella Spearing, M.Ed., CCC-SLP Speech Therapy Acute Rehabilitation 08/16/2019, 2:17 PM

## 2019-08-16 NOTE — Telephone Encounter (Signed)
That's fine but I think she might be in the hospital. Ty.

## 2019-08-16 NOTE — Progress Notes (Addendum)
Physical Therapy Treatment Patient Details Name: Rhonda Golden MRN: [id number] DOB: [birth year] Today's Date: 08/16/2019    History of Present Illness Rhonda Golden is a 85 y.o. female with medical history significant of HTN; HLD; DM; PAD s/p leg stents; and prior h/o CVA (2013) presenting with left lower extremity weakness and slurred speech.  MRI on 08/12/19 revealed "12 x 10 mm acute infarct within the right ventral pons."     PT Comments    Pt performed gt training a few steps from bed to recliner.  She continues to require moderate assistance overall and presents with weakness on her L side.  Pt continues to benefit from intensive therapies at [facility] to improve strength and function before returning home.    Follow Up Recommendations  CIR     Equipment Recommendations  Wheelchair (measurements PT);3in1 (PT)    Recommendations for Other Services       Precautions / Restrictions Precautions Precautions: Fall;Other (comment) Precaution Comments: Left hemiplegia; LLE extensor tone Restrictions Weight Bearing Restrictions: No    Mobility  Bed Mobility Overal bed mobility: Needs Assistance Bed Mobility: Supine to Sit     Supine to sit: Mod assist     General bed mobility comments: Mod assistance to advance hips and elevate trunk into sitting.  Pt able to initiate LE advancement and follow commands for hand placement on railing.  Transfers Overall transfer level: Needs assistance   Transfers: Sit to/from Stand;Stand Pivot Transfers Sit to Stand: Mod assist Stand pivot transfers: Mod assist       General transfer comment: mod assistance to power up into sitting, performed x 2 reps.  Pt required hand over hand placement on L side. No buckling noted in LLE and able to follow commands to step towards her recliner chair towards her strong side.  Ambulation/Gait Ambulation/Gait assistance: Mod assist Gait Distance (Feet): 4 Feet Assistive device: Rolling walker (2 wheeled) Gait  Pattern/deviations: Step-to pattern;Decreased weight shift to left;Decreased stance time - left;Decreased step length - left     General Gait Details: Steps from edge of bed to recliner chair with turn and backing to seated surface.   Stairs             Wheelchair Mobility    Modified Rankin (Stroke Patients Only)       Balance Overall balance assessment: Needs assistance Sitting-balance support: Feet unsupported Sitting balance-Leahy Scale: Fair       Standing balance-Leahy Scale: Poor                              Cognition Arousal/Alertness: Awake/alert Behavior During Therapy: WFL for tasks assessed/performed Overall Cognitive Status: Impaired/Different from baseline Area of Impairment: Memory;Problem solving;Attention                   Current Attention Level: Sustained Memory: Decreased short-term memory       Problem Solving: Difficulty sequencing;Requires verbal cues;Slow processing General Comments: Pt A&Ox4, requires cues for problem solving and increased time for processing      Exercises      General Comments        Pertinent Vitals/Pain Pain Assessment: Faces Faces Pain Scale: No hurt Pain Location: L hand Pain Descriptors / Indicators: Sore Pain Intervention(s): Monitored during session;Repositioned    Home Living                      Prior Function  PT Goals (current goals can now be found in the care plan section) Acute Rehab PT Goals Patient Stated Goal: get stronger Potential to Achieve Goals: Good Progress towards PT goals: Progressing toward goals    Frequency    Min 4X/week      PT Plan Current plan remains appropriate    Co-evaluation              AM-PAC PT "6 Clicks" Mobility   Outcome Measure  Help needed turning from your back to your side while in a flat bed without using bedrails?: A Lot Help needed moving from lying on your back to sitting on the side of a  flat bed without using bedrails?: A Lot Help needed moving to and from a bed to a chair (including a wheelchair)?: A Lot Help needed standing up from a chair using your arms (e.g., wheelchair or bedside chair)?: A Lot Help needed to walk in hospital room?: A Lot Help needed climbing 3-5 steps with a railing? : Total 6 Click Score: 11    End of Session Equipment Utilized During Treatment: Gait belt Activity Tolerance: Patient tolerated treatment well Patient left: with call bell/phone within reach;in chair;with chair alarm set Nurse Communication: Mobility status PT Visit Diagnosis: Unsteadiness on feet (R26.81);Other abnormalities of gait and mobility (R26.89);Difficulty in walking, not elsewhere classified (R26.2);Other symptoms and signs involving the nervous system (R29.898);Hemiplegia and hemiparesis Hemiplegia - Right/Left: Left Hemiplegia - dominant/non-dominant: Non-dominant Hemiplegia - caused by: Cerebral infarction     Time: 1724-1746 PT Time Calculation (min) (ACUTE ONLY): 22 min  Charges:  $Therapeutic Activity: 8-22 mins                     Bonney Leitz , PTA Acute Rehabilitation Services Pager [phone] Office [phone]     Rhonda Golden Artis Delay 08/16/2019, 6:07 PM

## 2019-08-17 LAB — GLUCOSE, CAPILLARY
Glucose-Capillary: 107 mg/dL — ABNORMAL HIGH (ref 70–99)
Glucose-Capillary: 195 mg/dL — ABNORMAL HIGH (ref 70–99)
Glucose-Capillary: 80 mg/dL (ref 70–99)
Glucose-Capillary: 107 mg/dL — ABNORMAL HIGH (ref 70–99)

## 2019-08-17 LAB — SARS CORONAVIRUS 2 (TAT 6-24 HRS): SARS Coronavirus 2: NEGATIVE

## 2019-08-17 NOTE — Progress Notes (Signed)
  Speech Language Pathology Treatment: Dysphagia;Cognitive-Linquistic  Patient Details Name: Rhonda Golden MRN: [id number] DOB: 02-13-1935 Today's Date: 08/17/2019 Time: 5102-5852 SLP Time Calculation (min) (ACUTE ONLY): 23 min  Assessment / Plan / Recommendation Clinical Impression  Pt seen with lunch meal (dysphagia 3, thin liquids). Pt able to self feed. Adequate oral acceptance, slightly prolonged bolus formation/oral transit with dysphagia 3 solids (spaghetti and broccoli). Adequate oral clearance following the swallow, no overt s/s aspiration with dysphagia 3 solids or thin liquids via straw. Pt seen with regular solids (graham cracker): pt with difficulty forming cohesive bolus, prolonged oral holding (perhaps to allow cracker to soften) prior to piecemeal swallow. Pt able to clear remaining residue with liquid rinse. No overt s/s aspiration. Recommend continue dysphagia 3/thin liquids. Pt with mild dysarthric speech, improved by slowing rate, intelligibility improving. Patient's conversational speech marked by halting rate with some word finding difficulty. Patient engaged in functional task related to safety/awareness/ problem solving. Patient able to identify potentially unsafe situations with moderate-max cueing but exhibited difficulty verbalizing how to find solutions to the identified problems.    HPI HPI: Rhonda Golden is a 85 y.o. female with medical history significant of HTN; HLD; DM; PAD s/p leg stents; and prior h/o CVA (2013) presenting with neurologic symptoms.  MRI on 08/12/19 revealed "12 x 10 mm acute infarct within the right ventral pons." Pt reported that she participated in Speech Therapy for "lots of things" after her initial CVA in 2013.        SLP Plan  Continue with current plan of care       Recommendations  Diet recommendations: Dysphagia 3 (mechanical soft);Thin liquid Liquids provided via: Straw;Cup Medication Administration: Whole meds with puree Supervision:  Intermittent supervision to cue for compensatory strategies Compensations: Slow rate;Small sips/bites;Minimize environmental distractions Postural Changes and/or Swallow Maneuvers: Seated upright 90 degrees                Oral Care Recommendations: Oral care BID Follow up Recommendations: Home health SLP SLP Visit Diagnosis: Dysphagia, unspecified (R13.10) Plan: Continue with current plan of care       GO                Shella Spearing, M.Ed., CCC-SLP Speech Therapy Acute Rehabilitation  08/17/2019, 12:45 PM

## 2019-08-17 NOTE — Progress Notes (Addendum)
PROGRESS NOTE    Rhonda Golden  PPI:[id number] DOB: July 27, 1935 DOA: 08/11/2019 PCP: Sharlene Dory, DO     Brief Narrative:  Rhonda Golden is a 85 y.o. femalewith medical history significant ofHTN; HLD;DM; PAD s/p leg stents. Patient presented secondary to concern for stroke with symptoms of difficulty walking, dragging her leg, slurred speech.  Work-up has revealed acute stroke. While admitted, she had an episode of atrial fibrillation with RVR managed on diltiazem drip and started on heparin drip. Neurology as well as cardiology were consulted.  Patient converted to normal sinus rhythm and transition to oral diltiazem and Eliquis.  New events last 24 hours / Subjective: No complaints this morning  Assessment & Plan:   Principal Problem:   Right pontine CVA (HCC) Active Problems:   Diabetes mellitus type 2 in nonobese (HCC)   PAD (peripheral artery disease) (HCC)   Hyperlipidemia   Essential hypertension   Acute stroke -MRI significant for 1.2 x 1 cm right ventral pontine stroke. LDL of 60. Previous hemoglobin A1C 6.2. Transthoracic Echocardiogram significant for normal EF and no embolic source (poor image quality). Atrial fibrillation captured on telemetry. Started on heparin drip and now on Eliquis -Appreciate neurology -SNF placement pending   Paroxysmal atrial fibrillation -Newly diagnosed. Found to have a rhythm consisted with atrial fibrillation and rates as high as 120-130s. Started on Diltiazem drip without bolus. Rhythm currently sinus with controlled rates. Transthoracic Echocardiogram obtained and significant for normal EF and mild left atrium dilation. -Was on Cardizem drip, converted to normal sinus.  Now on oral Cardizem -Appreciate cardiology  Essential hypertension -Continue diltiazem, HCTZ, hydralazine, lisinopril   Hyperlipidemia -Continue Crestor  PAD -Per chart review, patient is s/p stents in right leg in [place]; unsure of exact  procedure. Patient is on Plavix as an outpatient, now on Eliquis    In agreement with assessment of the pressure ulcer as below:  Pressure Injury 08/12/19 Foot Right;Left Pink blanchable heels, lateral, medial and toes (Active)  08/12/19 1808  Location: Foot  Location Orientation: Right;Left  Staging:   Wound Description (Comments): Pink blanchable heels, lateral, medial and toes  Present on Admission:     DVT prophylaxis: Eliquis Code Status: Full code Family Communication: No family at bedside; updated daughter over the phone  Disposition Plan: Stable for discharge once SNF placement found   Consultants:   Neurology  Cardiology   Antimicrobials:  Anti-infectives (From admission, onward)   None       Objective: Vitals:   08/16/19 2200 08/17/19 0003 08/17/19 0403 08/17/19 0804  BP: 130/70 (!) 129/55 109/81 138/65  Pulse:  92 67 68  Resp:  18 16 12   Temp:  98.6 F (37 C) 98.6 F (37 C) 98.3 F (36.8 C)  TempSrc:  Oral Oral Oral  SpO2:  95% 94% 91%  Weight:      Height:        Intake/Output Summary (Last 24 hours) at 08/17/2019 0950 Last data filed at 08/17/2019 0400 Gross per 24 hour  Intake 120 ml  Output 1200 ml  Net -1080 ml   Filed Weights   08/12/19 2300  Weight: 68 kg    Examination: General exam: Appears calm and comfortable  Respiratory system: Clear to auscultation. Respiratory effort normal. Cardiovascular system: S1 & S2 heard, RRR. No pedal edema. Gastrointestinal system: Abdomen is nondistended, soft and nontender. Normal bowel sounds heard. Central nervous system: Alert and oriented.  Left upper extremity weakness compared to right Extremities: Symmetric in appearance  bilaterally  Skin: No rashes, lesions or ulcers on exposed skin  Psychiatry: Judgement and insight appear stable. Mood & affect appropriate.   Data Reviewed: I have personally reviewed following labs and imaging studies  CBC: Recent Labs  Lab 08/11/19 1936  08/11/19 2055 08/13/19 0257 08/14/19 0641 08/15/19 0350  WBC 10.3  --  9.7 8.8 11.6*  NEUTROABS 9.6*  --   --   --   --   HGB 13.6 15.0 12.3 11.9* 12.2  HCT 44.8 44.0 39.1 38.6 38.6  MCV 76.7*  --  74.9* 76.9* 74.4*  PLT 311  --  275 263 254   Basic Metabolic Panel: Recent Labs  Lab 08/11/19 1936 08/11/19 2055 08/14/19 0641 08/15/19 0350  NA 140 142 143 140  K 3.8 3.7 3.4* 3.7  CL 106 108 111 105  CO2 21*  --  22 23  GLUCOSE 167* 157* 104* 119*  BUN 24* 25* 11 12  CREATININE 1.08* 1.00 0.76 0.85  CALCIUM 9.5  --  8.7* 9.0  MG  --   --   --  1.9   GFR: Estimated Creatinine Clearance: 48.3 mL/min (by C-G formula based on SCr of 0.85 mg/dL). Liver Function Tests: Recent Labs  Lab 08/11/19 1936  AST 19  ALT 14  ALKPHOS 97  BILITOT 0.3  PROT 7.9  ALBUMIN 3.9   No results for input(s): LIPASE, AMYLASE in the last 168 hours. No results for input(s): AMMONIA in the last 168 hours. Coagulation Profile: Recent Labs  Lab 08/11/19 1936  INR 1.0   Cardiac Enzymes: No results for input(s): CKTOTAL, CKMB, CKMBINDEX, TROPONINI in the last 168 hours. BNP (last 3 results) No results for input(s): PROBNP in the last 8760 hours. HbA1C: No results for input(s): HGBA1C in the last 72 hours. CBG: Recent Labs  Lab 08/16/19 0608 08/16/19 1110 08/16/19 1628 08/16/19 2054 08/17/19 0628  GLUCAP 104* 185* 85 168* 107*   Lipid Profile: No results for input(s): CHOL, HDL, LDLCALC, TRIG, CHOLHDL, LDLDIRECT in the last 72 hours. Thyroid Function Tests: No results for input(s): TSH, T4TOTAL, FREET4, T3FREE, THYROIDAB in the last 72 hours. Anemia Panel: No results for input(s): VITAMINB12, FOLATE, FERRITIN, TIBC, IRON, RETICCTPCT in the last 72 hours. Sepsis Labs: No results for input(s): PROCALCITON, LATICACIDVEN in the last 168 hours.  Recent Results (from the past 240 hour(s))  SARS CORONAVIRUS 2 (TAT 6-24 HRS) Nasopharyngeal Nasopharyngeal Swab     Status: None    Collection Time: 08/12/19 11:02 AM   Specimen: Nasopharyngeal Swab  Result Value Ref Range Status   SARS Coronavirus 2 NEGATIVE NEGATIVE Final    Comment: (NOTE) SARS-CoV-2 target nucleic acids are NOT DETECTED. The SARS-CoV-2 RNA is generally detectable in upper and lower respiratory specimens during the acute phase of infection. Negative results do not preclude SARS-CoV-2 infection, do not rule out co-infections with other pathogens, and should not be used as the sole basis for treatment or other patient management decisions. Negative results must be combined with clinical observations, patient history, and epidemiological information. The expected result is Negative. Fact Sheet for Patients: HairSlick.no Fact Sheet for Healthcare Providers: quierodirigir.com This test is not yet approved or cleared by the [place] FDA and  has been authorized for detection and/or diagnosis of SARS-CoV-2 by FDA under an Emergency Use Authorization (EUA). This EUA will remain  in effect (meaning this test can be used) for the duration of the COVID-19 declaration under Section 56 4(b)(1) of the Act, 21 U.S.C. section  360bbb-3(b)(1), unless the authorization is terminated or revoked sooner. Performed at [facility], [place] [address], [place], [place] [address]       Radiology Studies: No results found.    Scheduled Meds: . apixaban  5 mg Oral BID  . brimonidine  1 drop Both Eyes TID  . chlorhexidine  15 mL Mouth/Throat BID  . diltiazem  120 mg Oral Daily  . feeding supplement (ENSURE ENLIVE)  237 mL Oral BID BM  . hydrALAZINE  50 mg Oral TID  . hydrochlorothiazide  25 mg Oral Daily  . insulin aspart  0-15 Units Subcutaneous TID WC  . insulin aspart  0-5 Units Subcutaneous QHS  . lisinopril  20 mg Oral Daily  . rosuvastatin  10 mg Oral Daily   Continuous Infusions:   LOS: 5 days      Time spent: 20 minutes    Dessa Phi, DO [facility] 08/17/2019, 9:50 AM   Available via Epic secure chat 7am-7pm After these hours, please refer to coverage provider listed on [email or web address]

## 2019-08-17 NOTE — Progress Notes (Signed)
Nutrition Follow-up  DOCUMENTATION CODES:   Not applicable  INTERVENTION:  Continue Ensure Enlive po BID, each supplement provides 350 kcal and 20 grams of protein  Encourage adequate PO intake.   NUTRITION DIAGNOSIS:   Inadequate oral intake related to acute illness(Right pontine CVA) as evidenced by estimated needs(per chart, pt consuming 43% average of meals); progressing  GOAL:   Patient will meet greater than or equal to 90% of their needs; progressing  MONITOR:   PO intake, Supplement acceptance, I & O's, Labs, Weight trends  REASON FOR ASSESSMENT:   Consult Other (Comment)(CVA)  ASSESSMENT:   85 year old female with past medical history of HTN, HLD, T2DM, PAD s/p leg stents, and prior CVA (2013) admitted for right pontine CVA. MRI significant for 1.2 x 1 cm right ventral pontine stroke.  Pt is currently on a dysphagia 3 diet with thin liquids. Meal completion has been 50-75%. Pt reports having a good appetite with no difficulties. Pt reports usually consuming 3 meals a day with an Ensure shake once daily PTA at home. Pt currently has Ensure ordered and has been consuming them. RD to continue with current orders to aid in caloric and protein needs. Labs and medications reviewed.   NUTRITION - FOCUSED PHYSICAL EXAM:    Most Recent Value  Orbital Region  Moderate depletion  Upper Arm Region  No depletion  Thoracic and Lumbar Region  No depletion  Buccal Region  Moderate depletion  Temple Region  Moderate depletion  Clavicle Bone Region  No depletion  Clavicle and Acromion Bone Region  No depletion  Scapular Bone Region  Unable to assess  Dorsal Hand  Unable to assess  Patellar Region  No depletion  Anterior Thigh Region  No depletion  Posterior Calf Region  No depletion  Edema (RD Assessment)  Mild  Hair  Reviewed  Eyes  Reviewed  Mouth  Reviewed  Skin  Reviewed  Nails  Reviewed       Diet Order:   Diet Order            DIET DYS 3 Room service  appropriate? Yes with Assist; Fluid consistency: Thin  Diet effective now              EDUCATION NEEDS:   No education needs have been identified at this time  Skin:  Skin Assessment: Reviewed RN Assessment  Last BM:  1/5  Height:   Ht Readings from Last 1 Encounters:  08/12/19 5\' 4"  (1.626 m)    Weight:   Wt Readings from Last 1 Encounters:  08/12/19 68 kg    Ideal Body Weight:  54.5 kg  BMI:  Body mass index is 25.73 kg/m.  Estimated Nutritional Needs:   Kcal:  1600-1800  Protein:  70-80 grams  Fluid:  >/= 1.5 L/day    10/10/19, MS, RD, LDN Pager # [phone] After hours/ weekend pager # [phone]

## 2019-08-17 NOTE — Progress Notes (Signed)
Physical Therapy Treatment Patient Details Name: Rhonda Golden MRN: [id number] DOB: 11/27/1934 Today's Date: 08/17/2019    History of Present Illness Rhonda Golden is a 85 y.o. female with medical history significant of HTN; HLD; DM; PAD s/p leg stents; and prior h/o CVA (2013) presenting with left lower extremity weakness and slurred speech.  MRI on 08/12/19 revealed "12 x 10 mm acute infarct within the right ventral pons."     PT Comments    Pt performed gt training and functional mobility during session this am.  She continues to be limited due to L knee and hip pain in stance phase.  Pt required cues for sequencing and maintain safe proximity to RW.  Based on presentation and deficits she continues to be a great candidate for aggressive rehab at [facility].  Plan next session for continued progression of functional mobility.    Follow Up Recommendations  [facility]     Equipment Recommendations  Wheelchair (measurements PT);3in1 (PT)    Recommendations for Other Services Rehab consult     Precautions / Restrictions Precautions Precautions: Fall;Other (comment) Precaution Comments: Left hemiplegia; LLE extensor tone Restrictions Weight Bearing Restrictions: No    Mobility  Bed Mobility Overal bed mobility: Needs Assistance Bed Mobility: Supine to Sit;Rolling;Sidelying to Sit Rolling: Mod assist Sidelying to sit: Mod assist       General bed mobility comments: assistance to roll with cues for RUE hand placement.  Assistance to advance B LEs and assistance to elevate trunk into a seated position.   Once in sitting holding to foot board with RUE.  Transfers Overall transfer level: Needs assistance Equipment used: Rolling walker (2 wheeled) Transfers: Sit to/from [facility] Sit to Stand: Mod assist Stand pivot transfers: Mod assist       General transfer comment: Cues for hand placement to and from seated surface.  PTA assisted LUE onto RFW hand  grip.  Ambulation/Gait Ambulation/Gait assistance: Mod assist Gait Distance (Feet): 18 Feet Assistive device: Rolling walker (2 wheeled) Gait Pattern/deviations: Step-to pattern;Decreased weight shift to left;Decreased stance time - left;Decreased step length - left;Decreased stride length;Trunk flexed;Narrow base of support Gait velocity: decreased   General Gait Details: Pt with narrow BOS.  Cues for increasing stride length and utilizing B UEs when in stance phase on L.  Close chair follow for safetry.   Stairs             Wheelchair Mobility    Modified Rankin (Stroke Patients Only) Modified Rankin (Stroke Patients Only) Pre-Morbid Rankin Score: Slight disability Modified Rankin: Moderately severe disability     Balance Overall balance assessment: Needs assistance Sitting-balance support: Feet supported Sitting balance-Leahy Scale: Fair       Standing balance-Leahy Scale: Poor                              Cognition Arousal/Alertness: Awake/alert Behavior During Therapy: WFL for tasks assessed/performed Overall Cognitive Status: Impaired/Different from baseline Area of Impairment: Memory;Problem solving;Attention                   Current Attention Level: Sustained Memory: Decreased short-term memory       Problem Solving: Difficulty sequencing;Requires verbal cues;Slow processing General Comments: Pt A&Ox4, requires cues for problem solving and increased time for processing      Exercises      General Comments        Pertinent Vitals/Pain Pain Assessment: 0-10 Pain Score: 3  Faces  Pain Scale: Hurts a little bit Pain Location: left leg/back Pain Descriptors / Indicators: Discomfort Pain Intervention(s): Monitored during session;Repositioned    Home Living                      Prior Function            PT Goals (current goals can now be found in the care plan section) Acute Rehab PT Goals Patient Stated Goal:  get stronger Potential to Achieve Goals: Good Progress towards PT goals: Progressing toward goals    Frequency    Min 4X/week      PT Plan Current plan remains appropriate    Co-evaluation              AM-PAC PT "6 Clicks" Mobility   Outcome Measure  Help needed turning from your back to your side while in a flat bed without using bedrails?: A Lot Help needed moving from lying on your back to sitting on the side of a flat bed without using bedrails?: A Lot Help needed moving to and from a bed to a chair (including a wheelchair)?: A Lot Help needed standing up from a chair using your arms (e.g., wheelchair or bedside chair)?: A Lot Help needed to walk in hospital room?: A Lot Help needed climbing 3-5 steps with a railing? : Total 6 Click Score: 11    End of Session Equipment Utilized During Treatment: Gait belt Activity Tolerance: Patient tolerated treatment well Patient left: with call bell/phone within reach;in chair;with chair alarm set Nurse Communication: Mobility status PT Visit Diagnosis: Unsteadiness on feet (R26.81);Other abnormalities of gait and mobility (R26.89);Difficulty in walking, not elsewhere classified (R26.2);Other symptoms and signs involving the nervous system (R29.898);Hemiplegia and hemiparesis Hemiplegia - Right/Left: Left Hemiplegia - dominant/non-dominant: Non-dominant Hemiplegia - caused by: Cerebral infarction     Time: 6415-8309 PT Time Calculation (min) (ACUTE ONLY): 17 min  Charges:  $Gait Training: 8-22 mins                     Bonney Leitz , PTA Acute Rehabilitation Services Pager [phone] Office [phone]     Rhonda Golden Artis Delay 08/17/2019, 2:55 PM

## 2019-08-18 LAB — GLUCOSE, CAPILLARY
Glucose-Capillary: 134 mg/dL — ABNORMAL HIGH (ref 70–99)
Glucose-Capillary: 121 mg/dL — ABNORMAL HIGH (ref 70–99)

## 2019-08-18 MED ORDER — ALPHAGAN P 0.1 % OP SOLN: 1.0000 [drp] | Freq: Three times a day (TID) | OPHTHALMIC | Status: AC

## 2019-08-18 MED ORDER — DILTIAZEM HCL ER COATED BEADS 120 MG PO CP24: 120.0000 mg | ORAL_CAPSULE | Freq: Every day | ORAL | 3 refills | Status: AC

## 2019-08-18 MED ORDER — APIXABAN 5 MG PO TABS: 5.0000 mg | ORAL_TABLET | Freq: Two times a day (BID) | ORAL | 3 refills | Status: AC

## 2019-08-18 MED ORDER — LISINOPRIL 20 MG PO TABS: 20.0000 mg | ORAL_TABLET | Freq: Every day | ORAL | 3 refills | Status: AC

## 2019-08-18 NOTE — Discharge Summary (Addendum)
Physician Discharge Summary  Rhonda Golden WUJ:[id number] DOB: [birth year] DOA: 08/11/2019  PCP: Sharlene Dory, DO  Admit date: 08/11/2019 Discharge date: 08/18/2019  Admitted From: Home Disposition:  SNF  Recommendations for Outpatient Follow-up:  1. Follow up with PCP in 1-2 weeks 2. Please obtain BMP/CBC in one week   Home Health:no Equipment/Devices:None  Discharge Condition:Stable CODE STATUS:Full Diet recommendation: Heart Healthy   Brief/Interim Summary: 85 y.o.femalewith medical history significant ofHTN; HLD;DM; PAD s/p leg stents. Patient presented secondary to concern for stroke with symptoms of difficulty walking, dragging her leg, slurred speech.  Work-up has revealed acute stroke.   Discharge Diagnoses:  Principal Problem:   Right pontine CVA (HCC) Active Problems:   Diabetes mellitus type 2 in nonobese [facility])   PAD (peripheral artery disease) (HCC)   Hyperlipidemia   Essential hypertension Acute CVA: Neurology was consulted and MRI showed a right ventricular pontine stroke, 2D echo showed no source of emboli.  She went into A. fib with RVR see below for further details, she will go home on Eliquis. Physical therapy evaluated the patient and recommended skilled nursing facility.  Paroxysmal atrial fibrillation: Newly diagnosed she was started on IV insulin drip and anticoagulation she has been changed to oral diltiazem she will continue as an outpatient indefinitely.  Essential hypertension: Continue diltiazem hydrochlorothiazide hydralazine and lisinopril.  Hyperlipidemia: Continue Crestor.  PAD: Status post stenting to the right leg in [place] she was on Plavix and outpatient and will be on Eliquis.   Discharge Instructions  Discharge Instructions    Diet - low sodium heart healthy   Complete by: As directed    Increase activity slowly   Complete by: As directed      Allergies as of 08/18/2019   No Known Allergies     Medication  List    STOP taking these medications   predniSONE 20 MG tablet Commonly known as: DELTASONE   Soothe 0.6-0.6 % Soln Generic drug: Propylene Glycol-Glycerin     TAKE these medications   Alphagan P 0.1 % Soln Generic drug: brimonidine Place 1 drop into both eyes every 8 (eight) hours. unknown What changed: when to take this   amLODipine 10 MG tablet Commonly known as: NORVASC Take 1 tablet (10 mg total) by mouth daily.   apixaban 5 MG Tabs tablet Commonly known as: ELIQUIS Take 1 tablet (5 mg total) by mouth 2 (two) times daily.   cholecalciferol 25 MCG (1000 UT) tablet Commonly known as: VITAMIN D Take 1,000 Units by mouth daily.   clopidogrel 75 MG tablet Commonly known as: PLAVIX Take 1 tablet (75 mg total) by mouth daily.   diltiazem 120 MG 24 hr capsule Commonly known as: CARDIZEM CD Take 1 capsule (120 mg total) by mouth daily. Start taking on: August 19, 2019   hydrALAZINE 50 MG tablet Commonly known as: APRESOLINE Take 1 tablet (50 mg total) by mouth 3 (three) times daily.   hydrochlorothiazide 25 MG tablet Commonly known as: HYDRODIURIL Take 1 tablet (25 mg total) by mouth daily.   lisinopril 20 MG tablet Commonly known as: ZESTRIL Take 1 tablet (20 mg total) by mouth daily. Start taking on: August 19, 2019   metFORMIN 500 MG tablet Commonly known as: GLUCOPHAGE Take 1 tablet (500 mg total) by mouth daily.   multivitamin with minerals tablet Take 1 tablet by mouth daily.   omega-3 acid ethyl esters 1 g capsule Commonly known as: LOVAZA Take 1 g by mouth daily.   rosuvastatin 10  MG tablet Commonly known as: CRESTOR Take 1 tablet (10 mg total) by mouth daily.      Contact information for after-discharge care    Destination    [facility] Preferred SNF .   Service: Skilled Nursing Contact information: [address] [place] [address] [phone]             No Known  Allergies  Consultations:  Cardiology  Neurology   Procedures/Studies: CT Head Wo Contrast  Result Date: 08/12/2019 CLINICAL DATA:  Cerebral hemorrhage suspected. Additional provided: Numbness in hands, bilateral grips equal. Muffled speech. EXAM: CT HEAD WITHOUT CONTRAST TECHNIQUE: Contiguous axial images were obtained from the base of the skull through the vertex without intravenous contrast. COMPARISON:  No pertinent prior studies available for comparison. FINDINGS: Brain: No evidence of acute intracranial hemorrhage. No evidence of acute demarcated cortical infarction. Chronic right PCA vascular territory cortically based infarct involving the right parietooccipital and posteromedial right temporal lobes. No evidence of intracranial mass. No midline shift or extra-axial fluid collection. Mild ill-defined hypoattenuation within the cerebral white matter is nonspecific, but consistent with chronic small vessel ischemic disease. Mild generalized parenchymal atrophy. Mineralization within the bilateral basal ganglia. Vascular: No hyperdense vessel.  Atherosclerotic calcifications. Skull: Normal. Negative for fracture or focal lesion. Sinuses/Orbits: Visualized orbits demonstrate no acute abnormality. Mild ethmoid sinus mucosal thickening. No significant mastoid effusion. IMPRESSION: No CT evidence of acute intracranial abnormality. Chronic right PCA vascular territory cortically based infarct. Mild generalized parenchymal atrophy and chronic small vessel ischemic disease. Electronically Signed   By: Jackey Loge DO   On: 08/12/2019 06:11   MR BRAIN WO CONTRAST  Result Date: 08/12/2019 CLINICAL DATA:  Focal neuro deficit, greater than 6 hours, stroke suspected. EXAM: MRI HEAD WITHOUT CONTRAST TECHNIQUE: Multiplanar, multiecho pulse sequences of the brain and surrounding structures were obtained without intravenous contrast. COMPARISON:  Head CT 08/12/2019 FINDINGS: Brain: Multiple sequences are motion  degraded. Most notably, there is moderate motion degradation of the axial T2 FLAIR sequence 12 x 10 mm focus of restricted diffusion within the right ventral pons consistent with acute infarct (series 5, image 67). Corresponding T2/FLAIR hyperintensity at this site. Redemonstrated chronic right PCA territory cortically based infarct. No intracranial mass is identified. No midline shift or extra-axial fluid collection. There are several supratentorial chronic microhemorrhages. Small chronic lacunar infarcts within the basal ganglia, thalami and cerebellum. Background of otherwise mild chronic small vessel ischemic disease. Moderate generalized parenchymal atrophy. Asymmetric T2/FLAIR hyperintensity within the left petrous apex. Vascular: Flow voids maintained within the proximal large arterial vessels. Skull and upper cervical spine: No focal marrow lesion Sinuses/Orbits: Bilateral lens replacements. Minimal ethmoid sinus mucosal thickening. No significant mastoid effusion. IMPRESSION: Motion degraded examination. 12 x 10 mm acute infarct within the right ventral pons. Redemonstrated chronic right PCA territory cortically based infarct. Multiple small chronic lacunar infarcts within the basal ganglia, thalami and cerebellum. Background of otherwise mild chronic small vessel ischemic disease. Moderate generalized parenchymal atrophy. Asymmetric T2 hyperintensity within the left petrous apex. This finding is nonspecific, but may reflect trapped fluid. Electronically Signed   By: Jackey Loge DO   On: 08/12/2019 09:43   ECHOCARDIOGRAM COMPLETE  Result Date: 08/12/2019   ECHOCARDIOGRAM REPORT   Patient Name:   Rhonda Golden Date of Exam: 08/12/2019 Medical Rec #:  [id number]    Height:       64.0 in Accession #:    [id number]   Weight:       150.0  lb Date of Birth:  02/19/1935     BSA:          1.73 m Patient Age:    84 years     BP:           154/99 mmHg Patient Gender: F            HR:           125 bpm. Exam Location:   Inpatient Procedure: 2D Echo, Color Doppler and Cardiac Doppler Indications:    Stroke  History:        Patient has no prior history of Echocardiogram examinations.                 Risk Factors:Hypertension, Diabetes and Dyslipidemia.  Sonographer:    Irving Burton Senior RDCS Referring Phys: [id number] JENNIFER YATES IMPRESSIONS  1. Left ventricular ejection fraction, by visual estimation, is 60 to 65%. The left ventricle has normal function. Left ventricular septal wall thickness was normal. There is mildly increased left ventricular hypertrophy.  2. Left ventricular diastolic function could not be evaluated.  3. Global right ventricle was not well visualized.The right ventricular size is not well visualized. Right vetricular wall thickness was not assessed.  4. Left atrial size was mildly dilated.  5. Right atrial size was normal.  6. The mitral valve was not well visualized. No evidence of mitral valve regurgitation.  7. The tricuspid valve is not well visualized.  8. The aortic valve was not well visualized. Aortic valve regurgitation is not visualized. Mild aortic valve sclerosis without stenosis.  9. The pulmonic valve was not well visualized. Pulmonic valve regurgitation is not visualized. 10. Technically difficult echo. Poor image quality. 11. The atrial septum is grossly normal. FINDINGS  Left Ventricle: Left ventricular ejection fraction, by visual estimation, is 60 to 65%. The left ventricle has normal function. The left ventricle is not well visualized. There is mildly increased left ventricular hypertrophy. The left ventricular diastology could not be evaluated due to atrial fibrillation. Left ventricular diastolic function could not be evaluated. Right Ventricle: The right ventricular size is not well visualized. Right vetricular wall thickness was not assessed. Global RV systolic function is was not well visualized. Left Atrium: Left atrial size was mildly dilated. Right Atrium: Right atrial size was normal in  size Pericardium: There is no evidence of pericardial effusion. Mitral Valve: The mitral valve was not well visualized. No evidence of mitral valve regurgitation. Tricuspid Valve: The tricuspid valve is not well visualized. Tricuspid valve regurgitation is not demonstrated. Aortic Valve: The aortic valve was not well visualized. . There is mild thickening of the aortic valve. Aortic valve regurgitation is not visualized. Mild aortic valve sclerosis is present, with no evidence of aortic valve stenosis. There is mild thickening of the aortic valve. Pulmonic Valve: The pulmonic valve was not well visualized. Pulmonic valve regurgitation is not visualized. Pulmonic regurgitation is not visualized. Aorta: The aortic root and ascending aorta are structurally normal, with no evidence of dilitation. IAS/Shunts: The atrial septum is grossly normal. Additional Comments: Technically difficult echo. Poor image quality.  LEFT VENTRICLE PLAX 2D LVIDd:         2.80 cm LVIDs:         1.90 cm LV PW:         1.20 cm LV IVS:        1.20 cm LVOT diam:     2.10 cm LV SV:         18 ml  LV SV Index:   10.41 LVOT Area:     3.46 cm  RIGHT VENTRICLE TAPSE (M-mode): 1.7 cm LEFT ATRIUM             Index       RIGHT ATRIUM           Index LA diam:        4.00 cm 2.31 cm/m  RA Area:     14.20 cm LA Vol (A2C):   58.6 ml 33.85 ml/m RA Volume:   35.70 ml  20.62 ml/m LA Vol (A4C):   75.2 ml 43.44 ml/m LA Biplane Vol: 66.7 ml 38.53 ml/m  AORTIC VALVE LVOT Vmax:   78.30 cm/s LVOT Vmean:  56.000 cm/s LVOT VTI:    0.114 m  AORTA Ao Root diam: 2.60 cm  SHUNTS Systemic VTI:  0.11 m Systemic Diam: 2.10 cm  Kristeen MissPhilip Nahser MD Electronically signed by Kristeen MissPhilip Nahser MD Signature Date/Time: 08/12/2019/3:36:36 PM    Final    VAS US CAROTID  Result Date: 08/15/2019 Carotid Arterial Duplex Study Indications:       CVA and Weakness. Risk Factors:      Hypertension, hyperlipidemia, Diabetes, prior CVA. Comparison Study:  No prior study on file Performing  Technologist: Sherren Kernsandace Kanady RVS  Examination Guidelines: A complete evaluation includes B-mode imaging, spectral Doppler, color Doppler, and power Doppler as needed of all accessible portions of each vessel. Bilateral testing is considered an integral part of a complete examination. Limited examinations for reoccurring indications may be performed as noted.  Right Carotid Findings: +----------+--------+--------+--------+------------------+--------+           PSV cm/sEDV cm/sStenosisPlaque DescriptionComments +----------+--------+--------+--------+------------------+--------+ CCA Prox  81      17              homogeneous                +----------+--------+--------+--------+------------------+--------+ CCA Distal156     21              homogeneous                +----------+--------+--------+--------+------------------+--------+ ICA Prox  96      19              homogeneous       tortuous +----------+--------+--------+--------+------------------+--------+ ICA Distal98      19                                tortuous +----------+--------+--------+--------+------------------+--------+ ECA       204     10              heterogenous               +----------+--------+--------+--------+------------------+--------+ +----------+--------+-------+--------+-------------------+           PSV cm/sEDV cmsDescribeArm Pressure (mmHG) +----------+--------+-------+--------+-------------------+ [id number]                                        +----------+--------+-------+--------+-------------------+ +---------+--------+--+--------+--+ VertebralPSV cm/s41EDV cm/s10 +---------+--------+--+--------+--+  Left Carotid Findings: +----------+--------+--------+--------+------------------+------------------+           PSV cm/sEDV cm/sStenosisPlaque DescriptionComments           +----------+--------+--------+--------+------------------+------------------+ CCA Prox  96       13  intimal thickening +----------+--------+--------+--------+------------------+------------------+ CCA Distal121     16                                intimal thickening +----------+--------+--------+--------+------------------+------------------+ ICA Prox  106     21              heterogenous      tortuous           +----------+--------+--------+--------+------------------+------------------+ ICA Distal60      10                                tortuous           +----------+--------+--------+--------+------------------+------------------+ ECA       96      10                                                   +----------+--------+--------+--------+------------------+------------------+ +----------+--------+--------+--------+-------------------+           PSV cm/sEDV cm/sDescribeArm Pressure (mmHG) +----------+--------+--------+--------+-------------------+ [id number]                                          +----------+--------+--------+--------+-------------------+ +---------+--------+--+--------+-+ VertebralPSV cm/s70EDV cm/s7 +---------+--------+--+--------+-+  Summary: Right Carotid: Velocities in the right ICA are consistent with a 1-39% stenosis. Left Carotid: Velocities in the left ICA are consistent with a 1-39% stenosis. Vertebrals:  Bilateral vertebral arteries demonstrate antegrade flow. Subclavians: Normal flow hemodynamics were seen in bilateral subclavian              arteries. *See table(s) above for measurements and observations.  Electronically signed by Delia Heady MD on 08/15/2019 at 1:01:32 PM.    Final     Subjective: No new complaints.  Discharge Exam: Vitals:   08/18/19 0919 08/18/19 1138  BP: (!) 168/62 (!) 165/69  Pulse: 75 75  Resp:  17  Temp:  97.8 F (36.6 C)  SpO2:  98%   Vitals:   08/18/19 0332 08/18/19 0811 08/18/19 0919 08/18/19 1138  BP: 131/64 (!) 154/48 (!) 168/62 (!) 165/69   Pulse: 70 75 75 75  Resp: 20 16  17   Temp: 97.9 F (36.6 C) 98.4 F (36.9 C)  97.8 F (36.6 C)  TempSrc:  Oral  Oral  SpO2: 96% 95%  98%  Weight:      Height:        General: Pt is alert, awake, not in acute distress Cardiovascular: RRR, S1/S2 +, no rubs, no gallops Respiratory: CTA bilaterally, no wheezing, no rhonchi Abdominal: Soft, NT, ND, bowel sounds + Extremities: no edema, no cyanosis    The results of significant diagnostics from this hospitalization (including imaging, microbiology, ancillary and laboratory) are listed below for reference.     Microbiology: Recent Results (from the past 240 hour(s))  SARS CORONAVIRUS 2 (TAT 6-24 HRS) Nasopharyngeal Nasopharyngeal Swab     Status: None   Collection Time: 08/12/19 11:02 AM   Specimen: Nasopharyngeal Swab  Result Value Ref Range Status   SARS Coronavirus 2 NEGATIVE NEGATIVE Final    Comment: (NOTE) SARS-CoV-2 target nucleic acids are NOT DETECTED. The SARS-CoV-2 RNA is generally detectable in upper and  lower respiratory specimens during the acute phase of infection. Negative results do not preclude SARS-CoV-2 infection, do not rule out co-infections with other pathogens, and should not be used as the sole basis for treatment or other patient management decisions. Negative results must be combined with clinical observations, patient history, and epidemiological information. The expected result is Negative. Fact Sheet for Patients: HairSlick.no Fact Sheet for Healthcare Providers: quierodirigir.com This test is not yet approved or cleared by the [place] FDA and  has been authorized for detection and/or diagnosis of SARS-CoV-2 by FDA under an Emergency Use Authorization (EUA). This EUA will remain  in effect (meaning this test can be used) for the duration of the COVID-19 declaration under Section 56 4(b)(1) of the Act, 21 U.S.C. section 360bbb-3(b)(1),  unless the authorization is terminated or revoked sooner. Performed at [facility], [address], [place], [place] [address]   SARS CORONAVIRUS 2 (TAT 6-24 HRS) Nasopharyngeal Nasopharyngeal Swab     Status: None   Collection Time: 08/17/19  3:23 PM   Specimen: Nasopharyngeal Swab  Result Value Ref Range Status   SARS Coronavirus 2 NEGATIVE NEGATIVE Final    Comment: (NOTE) SARS-CoV-2 target nucleic acids are NOT DETECTED. The SARS-CoV-2 RNA is generally detectable in upper and lower respiratory specimens during the acute phase of infection. Negative results do not preclude SARS-CoV-2 infection, do not rule out co-infections with other pathogens, and should not be used as the sole basis for treatment or other patient management decisions. Negative results must be combined with clinical observations, patient history, and epidemiological information. The expected result is Negative. Fact Sheet for Patients: HairSlick.no Fact Sheet for Healthcare Providers: quierodirigir.com This test is not yet approved or cleared by the [place] FDA and  has been authorized for detection and/or diagnosis of SARS-CoV-2 by FDA under an Emergency Use Authorization (EUA). This EUA will remain  in effect (meaning this test can be used) for the duration of the COVID-19 declaration under Section 56 4(b)(1) of the Act, 21 U.S.C. section 360bbb-3(b)(1), unless the authorization is terminated or revoked sooner. Performed at [facility], [address] [place], [place] [address]      Labs: BNP (last 3 results) No results for input(s): BNP in the last 8760 hours. Basic Metabolic Panel: Recent Labs  Lab 08/11/19 1936 08/11/19 2055 08/14/19 0641 08/15/19 0350  NA 140 142 143 140  K 3.8 3.7 3.4* 3.7  CL 106 108 111 105  CO2 21*  --  22 23  GLUCOSE 167* 157* 104* 119*  BUN 24* 25* 11 12  CREATININE 1.08* 1.00 0.76 0.85   CALCIUM 9.5  --  8.7* 9.0  MG  --   --   --  1.9   Liver Function Tests: Recent Labs  Lab 08/11/19 1936  AST 19  ALT 14  ALKPHOS 97  BILITOT 0.3  PROT 7.9  ALBUMIN 3.9   No results for input(s): LIPASE, AMYLASE in the last 168 hours. No results for input(s): AMMONIA in the last 168 hours. CBC: Recent Labs  Lab 08/11/19 1936 08/11/19 2055 08/13/19 0257 08/14/19 0641 08/15/19 0350  WBC 10.3  --  9.7 8.8 11.6*  NEUTROABS 9.6*  --   --   --   --   HGB 13.6 15.0 12.3 11.9* 12.2  HCT 44.8 44.0 39.1 38.6 38.6  MCV 76.7*  --  74.9* 76.9* 74.4*  PLT 311  --  275 263 254   Cardiac Enzymes: No  results for input(s): CKTOTAL, CKMB, CKMBINDEX, TROPONINI in the last 168 hours. BNP: Invalid input(s): POCBNP CBG: Recent Labs  Lab 08/17/19 1152 08/17/19 1610 08/17/19 2138 08/18/19 0621 08/18/19 1136  GLUCAP 195* 80 107* 134* 121*   D-Dimer No results for input(s): DDIMER in the last 72 hours. Hgb A1c No results for input(s): HGBA1C in the last 72 hours. Lipid Profile No results for input(s): CHOL, HDL, LDLCALC, TRIG, CHOLHDL, LDLDIRECT in the last 72 hours. Thyroid function studies No results for input(s): TSH, T4TOTAL, T3FREE, THYROIDAB in the last 72 hours.  Invalid input(s): FREET3 Anemia work up No results for input(s): VITAMINB12, FOLATE, FERRITIN, TIBC, IRON, RETICCTPCT in the last 72 hours. Urinalysis No results found for: COLORURINE, APPEARANCEUR, [place], [place], [place], [place], [place], [place], PROTEINUR, UROBILINOGEN, NITRITE, LEUKOCYTESUR Sepsis Labs Invalid input(s): PROCALCITONIN,  WBC,  LACTICIDVEN Microbiology Recent Results (from the past 240 hour(s))  SARS CORONAVIRUS 2 (TAT 6-24 HRS) Nasopharyngeal Nasopharyngeal Swab     Status: None   Collection Time: 08/12/19 11:02 AM   Specimen: Nasopharyngeal Swab  Result Value Ref Range Status   SARS Coronavirus 2 NEGATIVE NEGATIVE Final    Comment: (NOTE) SARS-CoV-2 target nucleic acids are NOT  DETECTED. The SARS-CoV-2 RNA is generally detectable in upper and lower respiratory specimens during the acute phase of infection. Negative results do not preclude SARS-CoV-2 infection, do not rule out co-infections with other pathogens, and should not be used as the sole basis for treatment or other patient management decisions. Negative results must be combined with clinical observations, patient history, and epidemiological information. The expected result is Negative. Fact Sheet for Patients: SugarRoll.be Fact Sheet for Healthcare Providers: https://www.woods-mathews.com/ This test is not yet approved or cleared by the [place] FDA and  has been authorized for detection and/or diagnosis of SARS-CoV-2 by FDA under an Emergency Use Authorization (EUA). This EUA will remain  in effect (meaning this test can be used) for the duration of the COVID-19 declaration under Section 56 4(b)(1) of the Act, 21 U.S.C. section 360bbb-3(b)(1), unless the authorization is terminated or revoked sooner. Performed at [facility], [place] [address], [place], [place] [address]   SARS CORONAVIRUS 2 (TAT 6-24 HRS) Nasopharyngeal Nasopharyngeal Swab     Status: None   Collection Time: 08/17/19  3:23 PM   Specimen: Nasopharyngeal Swab  Result Value Ref Range Status   SARS Coronavirus 2 NEGATIVE NEGATIVE Final    Comment: (NOTE) SARS-CoV-2 target nucleic acids are NOT DETECTED. The SARS-CoV-2 RNA is generally detectable in upper and lower respiratory specimens during the acute phase of infection. Negative results do not preclude SARS-CoV-2 infection, do not rule out co-infections with other pathogens, and should not be used as the sole basis for treatment or other patient management decisions. Negative results must be combined with clinical observations, patient history, and epidemiological information. The expected result is Negative. Fact Sheet for  Patients: SugarRoll.be Fact Sheet for Healthcare Providers: https://www.woods-mathews.com/ This test is not yet approved or cleared by the [place] FDA and  has been authorized for detection and/or diagnosis of SARS-CoV-2 by FDA under an Emergency Use Authorization (EUA). This EUA will remain  in effect (meaning this test can be used) for the duration of the COVID-19 declaration under Section 56 4(b)(1) of the Act, 21 U.S.C. section 360bbb-3(b)(1), unless the authorization is terminated or revoked sooner. Performed at [facility], [place] [address] [place], [place] [address]      Time coordinating discharge: Over 40 minutes  SIGNED:   Marinda ElkAbraham Feliz Ortiz, MD  [facility] 08/18/2019, 1:20 PM Pager   If 7PM-7AM, please contact night-coverage [email or web address] Password [email or web address]

## 2019-08-18 NOTE — Progress Notes (Signed)
NURSING PROGRESS NOTE  Rhonda Golden [id number] Discharge Data: 08/18/2019 1:25 PM Attending Provider: Marinda Elk, MD [id number], Rhonda Roche, DO     Rhonda Golden discharged to [facility]  per MD order. Pt transported to facility via [facility] by stretcher. Report called to Rhonda Batten, LPN at [facility] and discussed the After Visit Summary and all questions fully answered. All IV's discontinued with no bleeding noted. All belongings returned to patient for patient to take with her to her facility.  Last Vital Signs:  Blood pressure (!) 165/69, pulse 75, temperature 97.8 F (36.6 C), temperature source Oral, resp. rate 17, height 5\' 4"  (1.626 m), weight 68 kg, SpO2 98 %.  Discharge Medication List Allergies as of 08/18/2019   No Known Allergies     Medication List    STOP taking these medications   predniSONE 20 MG tablet Commonly known as: DELTASONE   Soothe 0.6-0.6 % Soln Generic drug: Propylene Glycol-Glycerin     TAKE these medications   Alphagan P 0.1 % Soln Generic drug: brimonidine Place 1 drop into both eyes every 8 (eight) hours. unknown What changed: when to take this   amLODipine 10 MG tablet Commonly known as: NORVASC Take 1 tablet (10 mg total) by mouth daily.   apixaban 5 MG Tabs tablet Commonly known as: ELIQUIS Take 1 tablet (5 mg total) by mouth 2 (two) times daily.   cholecalciferol 25 MCG (1000 UT) tablet Commonly known as: VITAMIN D Take 1,000 Units by mouth daily.   clopidogrel 75 MG tablet Commonly known as: PLAVIX Take 1 tablet (75 mg total) by mouth daily.   diltiazem 120 MG 24 hr capsule Commonly known as: CARDIZEM CD Take 1 capsule (120 mg total) by mouth daily. Start taking on: August 19, 2019   hydrALAZINE 50 MG tablet Commonly known as: APRESOLINE Take 1 tablet (50 mg total) by mouth 3 (three) times daily.   hydrochlorothiazide 25 MG tablet Commonly known as: HYDRODIURIL Take 1 tablet (25 mg total)  by mouth daily.   lisinopril 20 MG tablet Commonly known as: ZESTRIL Take 1 tablet (20 mg total) by mouth daily. Start taking on: August 19, 2019   metFORMIN 500 MG tablet Commonly known as: GLUCOPHAGE Take 1 tablet (500 mg total) by mouth daily.   multivitamin with minerals tablet Take 1 tablet by mouth daily.   omega-3 acid ethyl esters 1 g capsule Commonly known as: LOVAZA Take 1 g by mouth daily.   rosuvastatin 10 MG tablet Commonly known as: CRESTOR Take 1 tablet (10 mg total) by mouth daily.

## 2019-08-18 NOTE — TOC Transition Note (Signed)
Transition of Care [facility]) - CM/SW Discharge Note   Patient Details  Name: Rhonda Golden MRN: [id number] Date of Birth: [birth year]  Transition of Care [facility]) CM/SW Contact:  Baldemar Lenis, LCSW Phone Number: 08/18/2019, 10:41 AM   Clinical Narrative:   Nurse to call report to [phone], Room 713    Final next level of care: Skilled Nursing Facility Barriers to Discharge: Barriers Resolved   Patient Goals and CMS Choice Patient states their goals for this hospitalization and ongoing recovery are:: to be as independent as possible CMS Medicare.gov Compare Post Acute Care list provided to:: Patient Represenative (must comment) Choice offered to / list presented to : Adult Children  Discharge Placement              Patient chooses bed at: [facility] [place] Patient to be transferred to facility by: [facility] Name of family member notified: Sabrina Patient and family notified of of transfer: 08/18/19  Discharge Plan and Services     Post Acute Care Choice: Skilled Nursing Facility                               Social Determinants of Health (SDOH) Interventions     Readmission Risk Interventions No flowsheet data found.

## 2019-08-18 NOTE — TOC Progression Note (Signed)
Transition of Care [facility]) - Progression Note    Patient Details  Name: Rhonda Golden MRN: [id number] Date of Birth: Dec 29, 1934  Transition [facility] CM/SW Contact  Baldemar Lenis, [place] Phone Number: 08/18/2019, 10:36 AM  Clinical Narrative:   CSW spoke with patient's daughter earlier today about SNF choices. Daughter was not satisfied with choices, asked about other facility options, but CSW indicated that the options that were available now would be the only options. CSW indicated that patient is stable for discharge when a bed can be selected, but daughter requested a call from the doctor to confirm that the patient is stable before making a decision. CSW asked MD to call the daughter. CSW received a call back from the daughter after talking to MD and daughter chose [facility]. CSW asked [facility] to start insurance authorization, and CSW asked MD for updated COVID test. CSW to follow for hopeful DC tomorrow.    Expected Discharge Plan: Skilled Nursing Facility Barriers to Discharge: Continued Medical Work up  Expected Discharge Plan and Services Expected Discharge Plan: Skilled Nursing Facility     Post Acute Care Choice: Skilled Nursing Facility Living arrangements for the past 2 months: Single Family Home Expected Discharge Date: 08/18/19                                     Social Determinants of Health (SDOH) Interventions    Readmission Risk Interventions No flowsheet data found.

## 2019-08-26 ENCOUNTER — Other Ambulatory Visit: Payer: Self-pay

## 2019-08-26 DIAGNOSIS — R339 Retention of urine, unspecified: Secondary | ICD-10-CM | POA: Diagnosis not present

## 2019-08-26 DIAGNOSIS — I10 Essential (primary) hypertension: Secondary | ICD-10-CM | POA: Insufficient documentation

## 2019-08-26 DIAGNOSIS — E876 Hypokalemia: Secondary | ICD-10-CM | POA: Insufficient documentation

## 2019-08-26 DIAGNOSIS — Z79899 Other long term (current) drug therapy: Secondary | ICD-10-CM | POA: Diagnosis not present

## 2019-08-26 DIAGNOSIS — N179 Acute kidney failure, unspecified: Secondary | ICD-10-CM | POA: Diagnosis not present

## 2019-08-26 DIAGNOSIS — R319 Hematuria, unspecified: Secondary | ICD-10-CM | POA: Diagnosis present

## 2019-08-26 DIAGNOSIS — Z7902 Long term (current) use of antithrombotics/antiplatelets: Secondary | ICD-10-CM | POA: Insufficient documentation

## 2019-08-26 DIAGNOSIS — E119 Type 2 diabetes mellitus without complications: Secondary | ICD-10-CM | POA: Insufficient documentation

## 2019-08-26 DIAGNOSIS — Z7984 Long term (current) use of oral hypoglycemic drugs: Secondary | ICD-10-CM | POA: Insufficient documentation

## 2019-08-26 LAB — CBC WITH DIFFERENTIAL/PLATELET
Abs Immature Granulocytes: 0.03 10*3/uL (ref 0.00–0.07)
Basophils Absolute: 0.1 10*3/uL (ref 0.0–0.1)
Basophils Relative: 1 %
Eosinophils Absolute: 0.1 10*3/uL (ref 0.0–0.5)
Eosinophils Relative: 1 %
HCT: 43.7 % (ref 36.0–46.0)
Hemoglobin: 13.3 g/dL (ref 12.0–15.0)
Immature Granulocytes: 0 %
Lymphocytes Relative: 15 %
Lymphs Abs: 1.5 10*3/uL (ref 0.7–4.0)
MCH: 23.3 pg — ABNORMAL LOW (ref 26.0–34.0)
MCHC: 30.4 g/dL (ref 30.0–36.0)
MCV: 76.5 fL — ABNORMAL LOW (ref 80.0–100.0)
Monocytes Absolute: 0.5 10*3/uL (ref 0.1–1.0)
Monocytes Relative: 5 %
Neutro Abs: 8.1 10*3/uL — ABNORMAL HIGH (ref 1.7–7.7)
Neutrophils Relative %: 78 %
Platelets: 304 10*3/uL (ref 150–400)
RBC: 5.71 MIL/uL — ABNORMAL HIGH (ref 3.87–5.11)
RDW: 17.1 % — ABNORMAL HIGH (ref 11.5–15.5)
WBC: 10.2 10*3/uL (ref 4.0–10.5)
nRBC: 0 % (ref 0.0–0.2)

## 2019-08-26 LAB — BASIC METABOLIC PANEL
Anion gap: 16 — ABNORMAL HIGH (ref 5–15)
BUN: 41 mg/dL — ABNORMAL HIGH (ref 8–23)
CO2: 24 mmol/L (ref 22–32)
Calcium: 9.3 mg/dL (ref 8.9–10.3)
Chloride: 105 mmol/L (ref 98–111)
Creatinine, Ser: 1.2 mg/dL — ABNORMAL HIGH (ref 0.44–1.00)
GFR calc Af Amer: 49 mL/min — ABNORMAL LOW (ref >60)
GFR calc non Af Amer: 42 mL/min — ABNORMAL LOW (ref >60)
Glucose, Bld: 117 mg/dL — ABNORMAL HIGH (ref 70–99)
Potassium: 3 mmol/L — ABNORMAL LOW (ref 3.5–5.1)
Sodium: 145 mmol/L (ref 135–145)

## 2019-08-26 NOTE — ED Triage Notes (Signed)
Patient from local nsg home.  EMS reported that staff had removed patients foley cath and after time period when she had not urinated on her own they attempted to place foley but were unsuccessful.  Patient now has bleeding.

## 2019-08-27 ENCOUNTER — Emergency Department
Admission: EM | Admit: 2019-08-27 | Discharge: 2019-08-27 | Disposition: A | Discharge status: Home / Self Care | Payer: Medicare HMO | Attending: Emergency Medicine | Admitting: Emergency Medicine

## 2019-08-27 DIAGNOSIS — E876 Hypokalemia: Secondary | ICD-10-CM

## 2019-08-27 DIAGNOSIS — R339 Retention of urine, unspecified: Secondary | ICD-10-CM

## 2019-08-27 DIAGNOSIS — N179 Acute kidney failure, unspecified: Secondary | ICD-10-CM

## 2019-08-27 LAB — URINALYSIS, COMPLETE (UACMP) WITH MICROSCOPIC
Bilirubin Urine: NEGATIVE
Glucose, UA: NEGATIVE mg/dL
Ketones, ur: NEGATIVE mg/dL
Nitrite: NEGATIVE
Protein, ur: 100 mg/dL — AB
RBC / HPF: >50 RBC/hpf — ABNORMAL HIGH (ref 0–5)
Specific Gravity, Urine: 1.018 (ref 1.005–1.030)
WBC, UA: >50 WBC/hpf — ABNORMAL HIGH (ref 0–5)
pH: 5 (ref 5.0–8.0)

## 2019-08-27 MED ORDER — SODIUM CHLORIDE 0.9 % IV SOLN
1.0000 g | Freq: Once | INTRAVENOUS | Status: AC
  Administered 2019-08-27: 06:00:00 1 g via INTRAVENOUS
  Filled 2019-08-27: qty 10

## 2019-08-27 MED ORDER — POTASSIUM CHLORIDE 10 MEQ/100ML IV SOLN
10.0000 meq | Freq: Once | INTRAVENOUS | Status: AC
  Administered 2019-08-27: 10 meq via INTRAVENOUS
  Filled 2019-08-27: qty 100

## 2019-08-27 MED ORDER — SODIUM CHLORIDE 0.9 % IV BOLUS
1000.0000 mL | Freq: Once | INTRAVENOUS | Status: AC
  Administered 2019-08-27: 06:00:00 1000 mL via INTRAVENOUS

## 2019-08-27 MED ORDER — POTASSIUM CHLORIDE CRYS ER 20 MEQ PO TBCR
40.0000 meq | EXTENDED_RELEASE_TABLET | Freq: Once | ORAL | Status: DC
  Filled 2019-08-27: qty 2

## 2019-08-27 MED ORDER — CEPHALEXIN 500 MG PO CAPS: 500.0000 mg | ORAL_CAPSULE | Freq: Three times a day (TID) | ORAL | 0 refills | Status: AC

## 2019-08-27 NOTE — ED Notes (Signed)
Patient repositioned and pillow placed under her buttocks in wheelchair.

## 2019-08-27 NOTE — ED Notes (Signed)
Rocephin has not infused, entered completion in error. Will hang potassium after rocephin complete.

## 2019-08-27 NOTE — ED Provider Notes (Signed)
[facility] Emergency Department Provider Note  ____________________________________________  Time seen: Approximately 5:56 AM  I have reviewed the triage vital signs and the nursing notes.   HISTORY  Chief Complaint Bleeding Urtheral vs Vaginal   HPI Rhonda Golden is a 85 y.o. female with a history of recent CVA 2 weeks ago, s/p foley catheter placement, DM, HLD, HTN, PAD who presents for evaluation of hematuria. Patient is currently in rehab. Had her foley removed earlier today.  Patient was unable to void and an attempted to place the Foley catheter was unsuccessful.  Patient started having bleeding from her urethra and transfer here for evaluation.  Patient is on Eliquis. Patient denies abdominal pain or vaginal pain or flank pain.  A Foley catheter was easily placed upon arrival.  Past Medical History:  Diagnosis Date  . CVA (cerebral vascular accident) (HCC)    2013; 2021  . Diabetes mellitus (HCC)   . Hyperlipidemia   . Hypertension   . PAD (peripheral artery disease) (HCC)    leg stents    Patient Active Problem List   Diagnosis Date Noted  . Right pontine CVA (HCC) 08/12/2019  . Diabetes mellitus type 2 in nonobese (HCC) 05/18/2019  . PAD (peripheral artery disease) (HCC) 05/18/2019  . Hyperlipidemia 05/18/2019  . Essential hypertension 05/18/2019    No past surgical history on file.  Prior to Admission medications   Medication Sig Start Date End Date Taking? Authorizing Provider  ALPHAGAN P 0.1 % SOLN Place 1 drop into both eyes every 8 (eight) hours. unknown 08/18/19   Marinda Elk, MD  amLODipine (NORVASC) 10 MG tablet Take 1 tablet (10 mg total) by mouth daily. 05/18/19   Sharlene Dory, DO  apixaban (ELIQUIS) 5 MG TABS tablet Take 1 tablet (5 mg total) by mouth 2 (two) times daily. 08/18/19   Marinda Elk, MD  cephALEXin (KEFLEX) 500 MG capsule Take 1 capsule (500 mg total) by mouth 3 (three) times daily for 7  days. 08/27/19 09/03/19  Nita Sickle, MD  cholecalciferol (VITAMIN D) 25 MCG (1000 UT) tablet Take 1,000 Units by mouth daily.    [provider]  clopidogrel (PLAVIX) 75 MG tablet Take 1 tablet (75 mg total) by mouth daily. 05/18/19   Wendling, Jilda Roche, DO  diltiazem (CARDIZEM CD) 120 MG 24 hr capsule Take 1 capsule (120 mg total) by mouth daily. 08/19/19   Marinda Elk, MD  hydrALAZINE (APRESOLINE) 50 MG tablet Take 1 tablet (50 mg total) by mouth 3 (three) times daily. 05/18/19   Sharlene Dory, DO  hydrochlorothiazide (HYDRODIURIL) 25 MG tablet Take 1 tablet (25 mg total) by mouth daily. 05/18/19   Sharlene Dory, DO  lisinopril (ZESTRIL) 20 MG tablet Take 1 tablet (20 mg total) by mouth daily. 08/19/19   Marinda Elk, MD  metFORMIN (GLUCOPHAGE) 500 MG tablet Take 1 tablet (500 mg total) by mouth daily. 05/18/19   Sharlene Dory, DO  Multiple Vitamins-Minerals (MULTIVITAMIN WITH MINERALS) tablet Take 1 tablet by mouth daily.    [provider]  omega-3 acid ethyl esters (LOVAZA) 1 g capsule Take 1 g by mouth daily.    [provider]  rosuvastatin (CRESTOR) 10 MG tablet Take 1 tablet (10 mg total) by mouth daily. 05/18/19   Sharlene Dory, DO    Allergies Patient has no known allergies.  Family History  Problem Relation Age of Onset  . Hypertension Mother   . Hypertension Father  Social History Social History   Tobacco Use  . Smoking status: Never Smoker  . Smokeless tobacco: Never Used  Substance Use Topics  . Alcohol use: Not on file  . Drug use: Not on file    Review of Systems  Constitutional: Negative for fever. Eyes: Negative for visual changes. ENT: Negative for sore throat. Neck: No neck pain  Cardiovascular: Negative for chest pain. Respiratory: Negative for shortness of breath. Gastrointestinal: Negative for abdominal pain, vomiting or diarrhea. Genitourinary: Negative for  dysuria. Musculoskeletal: Negative for back pain. Skin: Negative for rash. Neurological: Negative for headaches, weakness or numbness. Psych: No SI or HI  ____________________________________________   PHYSICAL EXAM:  VITAL SIGNS: ED Triage Vitals  Enc Vitals Group     BP 08/26/19 2004 (!) 142/68     Pulse Rate 08/26/19 2004 79     Resp 08/26/19 2004 16     Temp 08/26/19 2004 98.3 F (36.8 C)     Temp Source 08/26/19 2004 Oral     SpO2 08/26/19 2004 100 %     Weight 08/26/19 2005 180 lb (81.6 kg)     Height 08/26/19 2005 5\' 4"  (1.626 m)     Head Circumference --      [facility] --      Pain Score 08/26/19 2004 0     Pain Loc --      Pain Edu? --      Excl. in [place]? --     Constitutional: Alert and oriented. Well appearing and in no apparent distress. HEENT:      Head: Normocephalic and atraumatic.         Eyes: Conjunctivae are normal. Sclera is non-icteric.       Mouth/Throat: Mucous membranes are moist.       Neck: Supple with no signs of meningismus. Cardiovascular: Regular rate and rhythm. No murmurs, gallops, or rubs. 2+ symmetrical distal pulses are present in all extremities. No JVD. Respiratory: Normal respiratory effort. Lungs are clear to auscultation bilaterally. No wheezes, crackles, or rhonchi.  Gastrointestinal: Soft, non tender, and non distended with positive bowel sounds. No rebound or guarding. Genitourinary: No CVA tenderness.  Foley in place draining very dark urine Musculoskeletal: Nontender with normal range of motion in all extremities. No edema, cyanosis, or erythema of extremities. Skin: Skin is warm, dry and intact. No rash noted. Psychiatric: Mood and affect are normal. Speech and behavior are normal.  ____________________________________________   LABS (all labs ordered are listed, but only abnormal results are displayed)  Labs Reviewed  CBC WITH DIFFERENTIAL/PLATELET - Abnormal; Notable for the following components:      Result Value    RBC 5.71 (*)    MCV 76.5 (*)    MCH 23.3 (*)    RDW 17.1 (*)    Neutro Abs 8.1 (*)    All other components within normal limits  BASIC METABOLIC PANEL - Abnormal; Notable for the following components:   Potassium 3.0 (*)    Glucose, Bld 117 (*)    BUN 41 (*)    Creatinine, Ser 1.20 (*)    GFR calc non Af Amer 42 (*)    GFR calc Af Amer 49 (*)    Anion gap 16 (*)    All other components within normal limits  URINALYSIS, COMPLETE (UACMP) WITH MICROSCOPIC - Abnormal; Notable for the following components:   Color, Urine AMBER (*)    APPearance CLOUDY (*)    Hgb urine dipstick LARGE (*)  Protein, ur 100 (*)    Leukocytes,Ua MODERATE (*)    RBC / HPF >50 (*)    WBC, UA >50 (*)    Bacteria, UA MANY (*)    All other components within normal limits  URINE CULTURE   ____________________________________________  EKG  none  ____________________________________________  RADIOLOGY  none  ____________________________________________   PROCEDURES  Procedure(s) performed: None Procedures Critical Care performed:  None ____________________________________________   INITIAL IMPRESSION / ASSESSMENT AND PLAN / ED COURSE  85 y.o. female with a history of recent CVA 2 weeks ago, s/p foley catheter placement, DM, HLD, HTN, PAD who presents for evaluation of hematuria after traumatic attempt to have her Foley catheter replaced after patient failed trial to void. Foley was placed here successfully. Patient given rocephin to prevent infection in the setting of several attempts at the rehab facility to place a Foley. Ucx sent. Labs showing mild AKI and AG. IVF given. Milk hypokalemia which was supplemented IV. Patient will be discharged back to rehab facility after fluids.  Discussed antibiotics as an outpatient and close follow-up with primary care doctor.  Discussed my standard return precautions.    _________________________ 7:18 AM on  08/27/2019 -----------------------------------------  Discussed plan with patient's daughter who is in agreement.    As part of my medical decision making, I reviewed the following data within the electronic MEDICAL RECORD NUMBER Nursing notes reviewed and incorporated, Labs reviewed , Old chart reviewed, Notes from prior ED visits and [place] Controlled Substance Database   Please note:  Patient was evaluated in Emergency Department today for the symptoms described in the history of present illness. Patient was evaluated in the context of the global COVID-19 pandemic, which necessitated consideration that the patient might be at risk for infection with the SARS-CoV-2 virus that causes COVID-19. Institutional protocols and algorithms that pertain to the evaluation of patients at risk for COVID-19 are in a state of rapid change based on information released by regulatory bodies including the CDC and federal and state organizations. These policies and algorithms were followed during the patient's care in the ED.  Some ED evaluations and interventions may be delayed as a result of limited staffing during the pandemic.   ____________________________________________   FINAL CLINICAL IMPRESSION(S) / ED DIAGNOSES   Final diagnoses:  Urinary retention  AKI (acute kidney injury) (HCC)  Hypokalemia      NEW MEDICATIONS STARTED DURING THIS VISIT:  ED Discharge Orders         Ordered    cephALEXin (KEFLEX) 500 MG capsule  3 times daily     08/27/19 0716           Note:  This document was prepared using Dragon voice recognition software and may include unintentional dictation errors.    Don Perking, [place], MD 08/27/19 [phone]

## 2019-08-27 NOTE — ED Notes (Signed)
Report to lorrie, rn.  

## 2019-08-27 NOTE — Discharge Instructions (Signed)

## 2019-08-29 LAB — URINE CULTURE: Culture: 70000 — AB

## 2019-09-16 ENCOUNTER — Telehealth: Payer: Self-pay | Admitting: Family Medicine

## 2019-09-16 NOTE — Telephone Encounter (Signed)
Clydie Braun with  [facility]  Calling to let us know that patient is in a skilled nursing facility and she will be sending over orders for PT and OT for Wendling to sign

## 2019-09-22 ENCOUNTER — Telehealth: Payer: Self-pay

## 2019-09-22 NOTE — Telephone Encounter (Signed)
Patients daughter Mrs. Mayford Knife called in to get Dr. Carmelia Roller to transfer her medical records to her PCP Dr. [place]   Dr. Bethena Midget phone number is [phone]   Can some one please follow up with the pt daughter Mrs. Willams at [phone]

## 2019-09-23 NOTE — Telephone Encounter (Signed)
Patient's daughter advised she will need to sent a sign release faxed to our medical records, fax number provided. In the mean time I advised her to activate her mychart account for faster access to records. Invitation to mychart sent to daughter's cell phone number.

## 2019-09-30 ENCOUNTER — Ambulatory Visit: Payer: Medicare HMO | Admitting: Urology

## 2019-11-14 ENCOUNTER — Encounter: Payer: Medicare HMO | Admitting: Family Medicine

## 2019-11-14 ENCOUNTER — Ambulatory Visit: Payer: Medicare HMO | Admitting: *Deleted

## 2021-04-05 IMAGING — CT CT HEAD W/O CM
4 series · 16 of 47 positions shown, 18 images · non-contrast
Comparison: No pertinent prior studies available for comparison.

CLINICAL DATA: Cerebral hemorrhage suspected. Additional provided:
Numbness in hands, bilateral grips equal. Muffled speech.

EXAM:
CT HEAD WITHOUT CONTRAST
TECHNIQUE: Contiguous axial images were obtained from the base of the skull
through the vertex without intravenous contrast.

[Series 3: head without · axial · non-contrast · 0.43mm/px · z∈[-126,-6]mm · 7 of 33 slices shown, 9 images]
[im 5/33  brain]
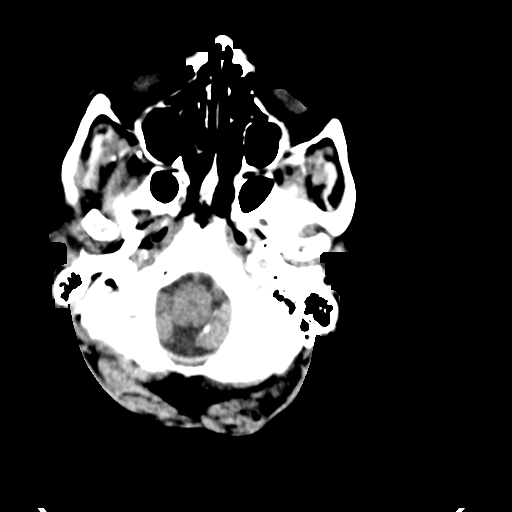
[im 5/33  bone]
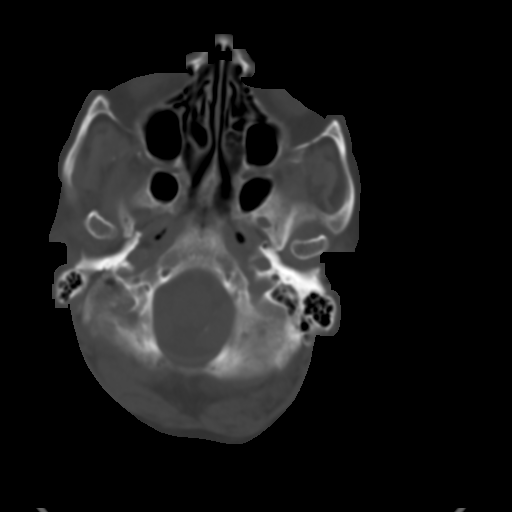
[im 9/33  brain]
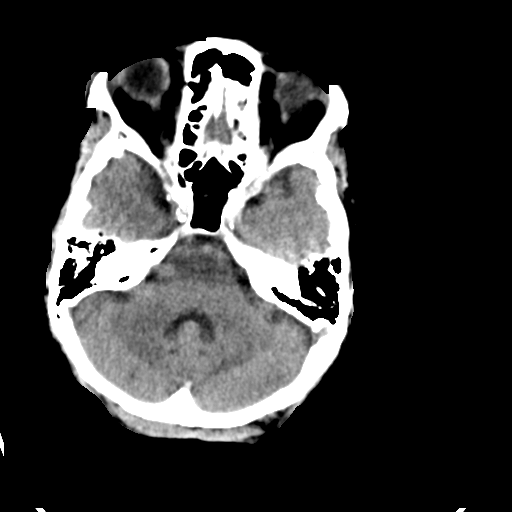
[im 13/33  brain]
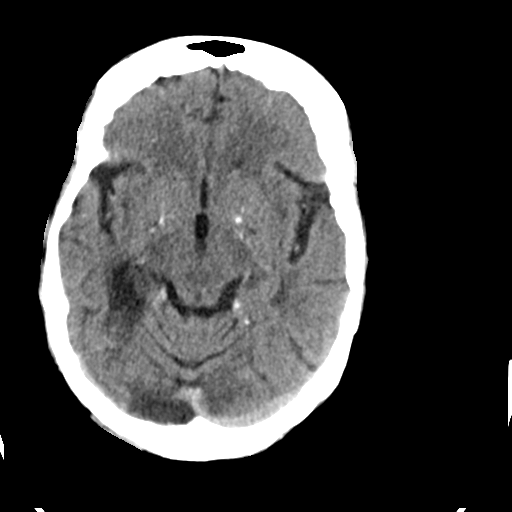
[im 17/33  brain]
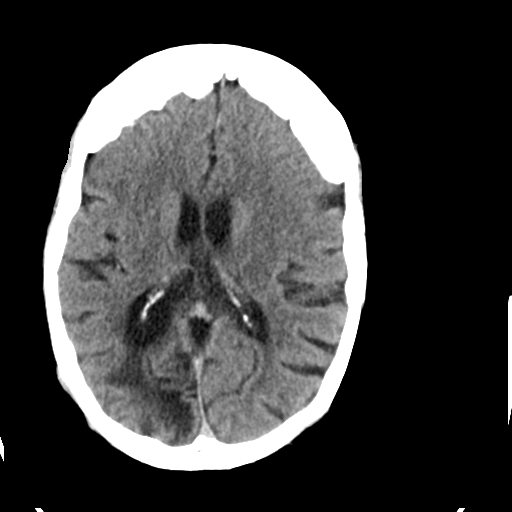
[im 21/33  brain]
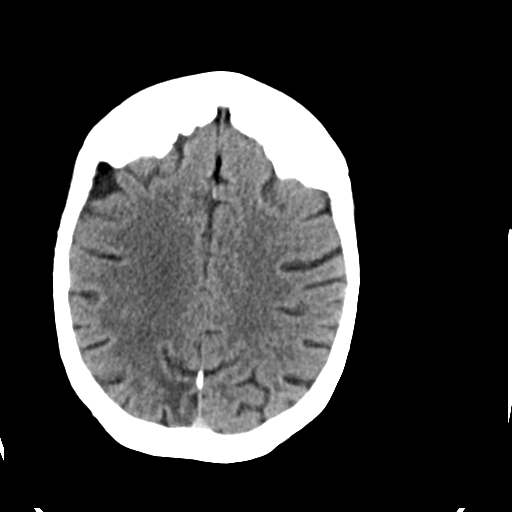
[im 21/33  bone]
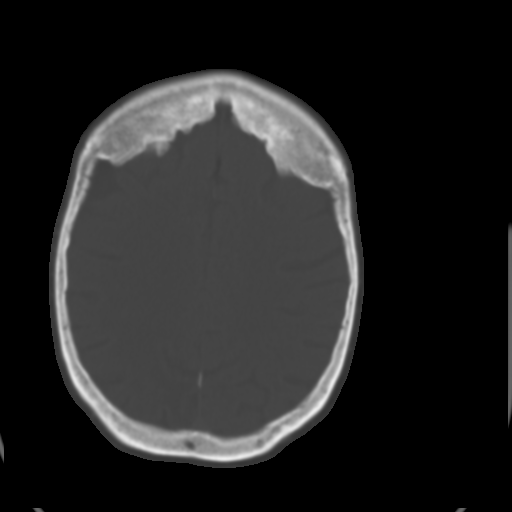
[im 25/33  brain]
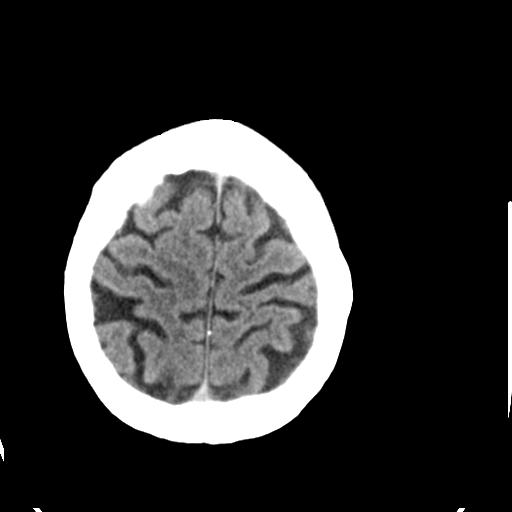
[im 29/33  brain]
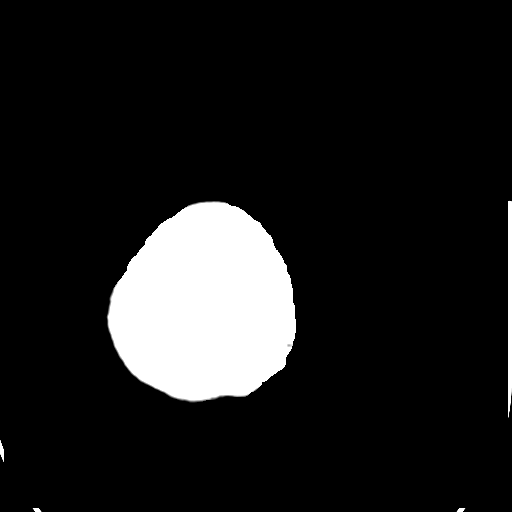

[Series 4: head bone · axial · 0.43mm/px · z∈[-130,-98]mm · 3 of 83 slices shown]
[im 9/83  bone]
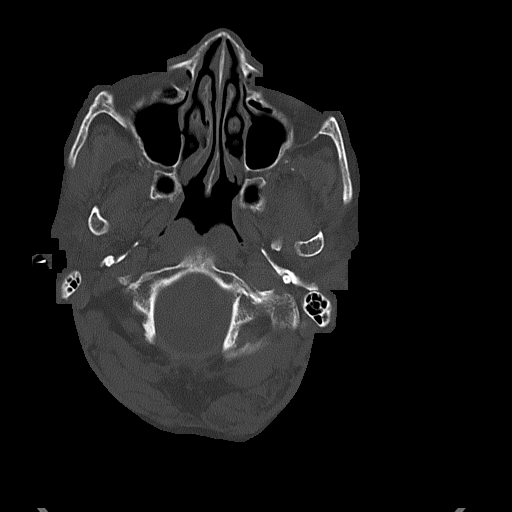
[im 17/83  bone]
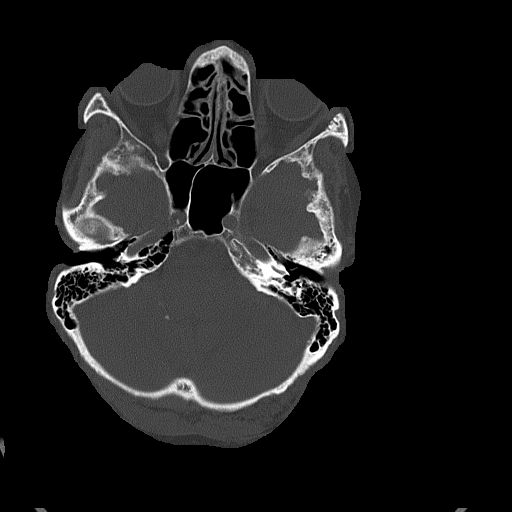
[im 25/83  bone]
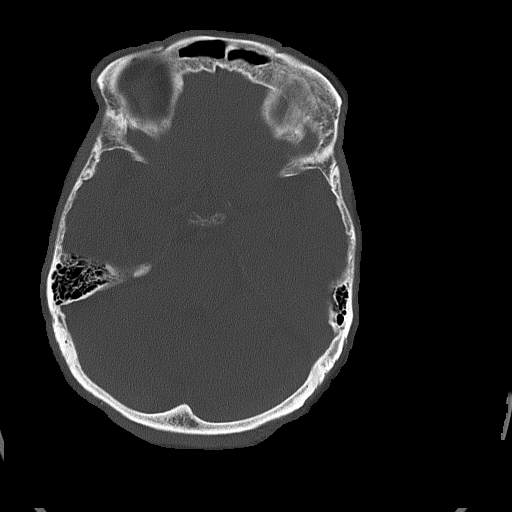

[Series 5: head without cor · coronal · non-contrast · 0.41mm/px · 3 of 76 slices shown]
[im 26/76  brain]
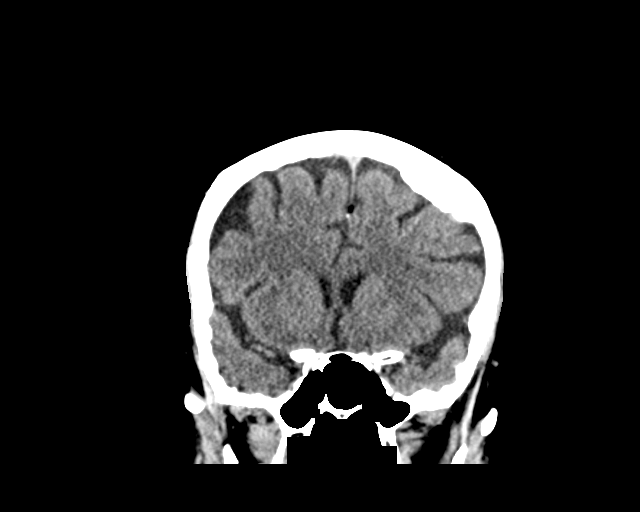
[im 34/76  brain]
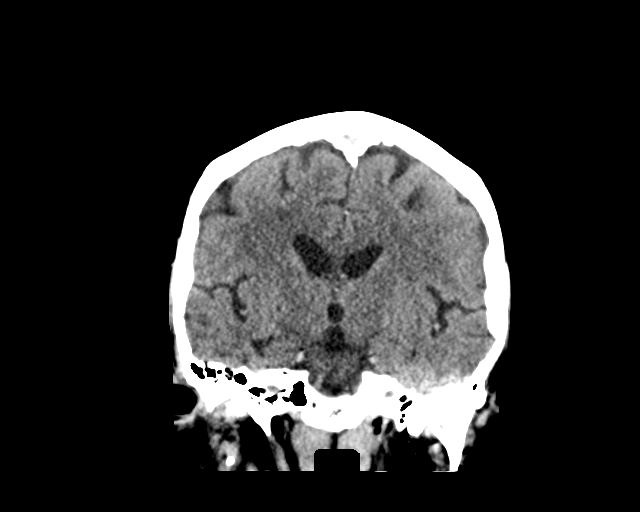
[im 42/76  brain]
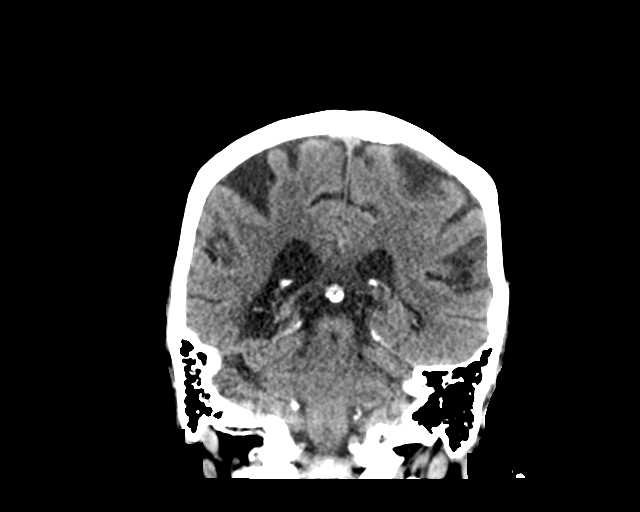

[Series 6: head without sag · sagittal · non-contrast · 0.36mm/px · 3 of 65 slices shown]
[im 22/65  brain]
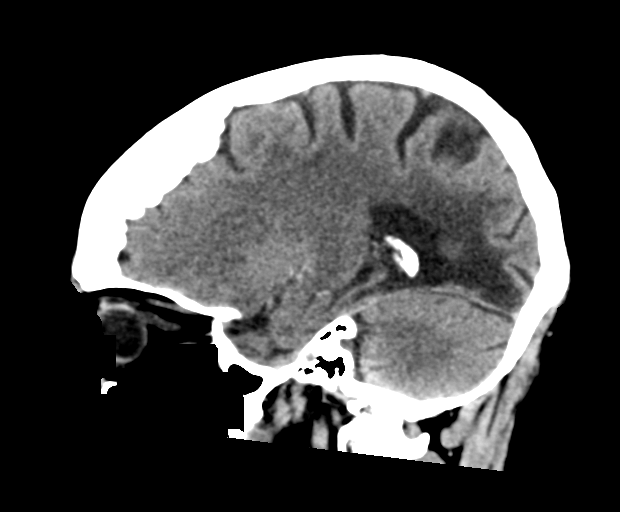
[im 33/65  brain]
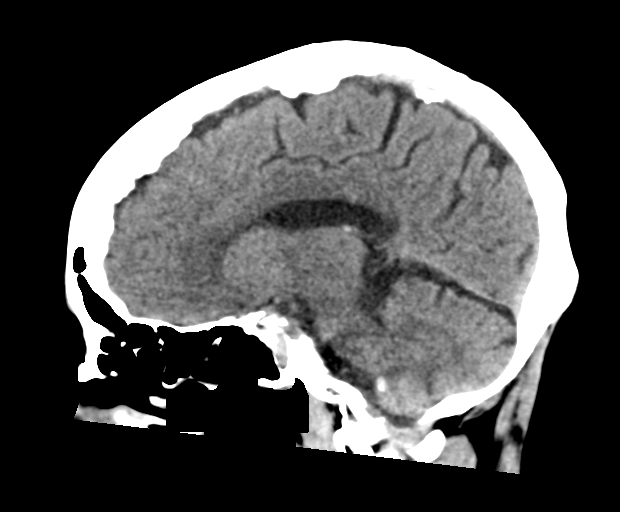
[im 43/65  brain]
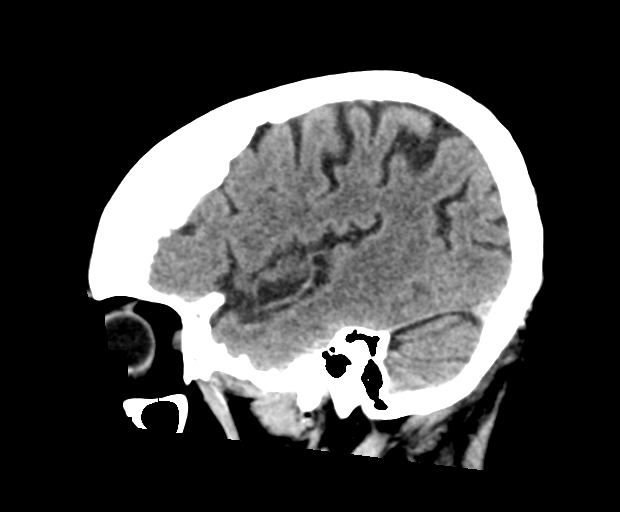

[16 of 47 positions shown; findings below may reference images not displayed]

FINDINGS: Brain:

No evidence of acute intracranial hemorrhage.

No evidence of acute demarcated cortical infarction. Chronic right
PCA vascular territory cortically based infarct involving the right
parietooccipital and posteromedial right temporal lobes.

No evidence of intracranial mass.

No midline shift or extra-axial fluid collection.

Mild ill-defined hypoattenuation within the cerebral white matter is
nonspecific, but consistent with chronic small vessel ischemic
disease. Mild generalized parenchymal atrophy. Mineralization within
the bilateral basal ganglia.

Vascular: No hyperdense vessel.  Atherosclerotic calcifications.

Skull: Normal. Negative for fracture or focal lesion.

Sinuses/Orbits: Visualized orbits demonstrate no acute abnormality.
Mild ethmoid sinus mucosal thickening. No significant mastoid
effusion.
IMPRESSION: No CT evidence of acute intracranial abnormality.

Chronic right PCA vascular territory cortically based infarct.

Mild generalized parenchymal atrophy and chronic small vessel
ischemic disease.
